# Patient Record
Sex: Male | Born: 1995 | Hispanic: Yes | Marital: Single | Smoking: Current every day smoker
Health system: Southern US, Community
[De-identification: ages and names within clinical notes are randomized; demographics above are authoritative.]

## PROBLEM LIST (undated history)

## (undated) DIAGNOSIS — Z720 Tobacco use: Secondary | ICD-10-CM

## (undated) DIAGNOSIS — F1410 Cocaine abuse, uncomplicated: Secondary | ICD-10-CM

## (undated) DIAGNOSIS — F1010 Alcohol abuse, uncomplicated: Secondary | ICD-10-CM

## (undated) DIAGNOSIS — F1290 Cannabis use, unspecified, uncomplicated: Secondary | ICD-10-CM

---
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
## 2016-12-17 ENCOUNTER — Encounter (HOSPITAL_COMMUNITY): Payer: Self-pay | Admitting: Cardiology

## 2016-12-17 ENCOUNTER — Emergency Department (HOSPITAL_COMMUNITY)
Admission: EM | Admit: 2016-12-17 | Discharge: 2016-12-17 | Disposition: A | Discharge status: Home / Self Care | Payer: Self-pay | Attending: Emergency Medicine | Admitting: Emergency Medicine

## 2016-12-17 DIAGNOSIS — R002 Palpitations: Secondary | ICD-10-CM | POA: Insufficient documentation

## 2016-12-17 DIAGNOSIS — F1099 Alcohol use, unspecified with unspecified alcohol-induced disorder: Secondary | ICD-10-CM | POA: Insufficient documentation

## 2016-12-17 DIAGNOSIS — F1210 Cannabis abuse, uncomplicated: Secondary | ICD-10-CM | POA: Insufficient documentation

## 2016-12-17 DIAGNOSIS — Z79899 Other long term (current) drug therapy: Secondary | ICD-10-CM | POA: Insufficient documentation

## 2016-12-17 DIAGNOSIS — F1410 Cocaine abuse, uncomplicated: Secondary | ICD-10-CM | POA: Insufficient documentation

## 2016-12-17 DIAGNOSIS — F17210 Nicotine dependence, cigarettes, uncomplicated: Secondary | ICD-10-CM | POA: Insufficient documentation

## 2016-12-17 LAB — I-STAT CHEM 8, ED
BUN: <3 mg/dL — ABNORMAL LOW (ref 6–20)
CREATININE: 0.9 mg/dL (ref 0.61–1.24)
Calcium, Ion: 1.11 mmol/L — ABNORMAL LOW (ref 1.15–1.40)
Chloride: 102 mmol/L (ref 101–111)
Glucose, Bld: 105 mg/dL — ABNORMAL HIGH (ref 65–99)
HEMATOCRIT: 43 % (ref 39.0–52.0)
HEMOGLOBIN: 14.6 g/dL (ref 13.0–17.0)
POTASSIUM: 3.6 mmol/L (ref 3.5–5.1)
SODIUM: 142 mmol/L (ref 135–145)
TCO2: 26 mmol/L (ref 0–100)

## 2016-12-17 NOTE — ED Triage Notes (Signed)
C/o heart racing.  Used cocaine 2 hours ago. Denies any pain.

## 2016-12-17 NOTE — Discharge Instructions (Signed)
Please stop using drugs or you will eventually end up on the streets as an addict.   If you need help there are multiple agencies in the area.  Thank you

## 2016-12-17 NOTE — ED Provider Notes (Signed)
[facility]-EMERGENCY DEPT Provider Note   CSN: [id number] Arrival date & time: 12/17/16  4098     History   Chief Complaint Chief Complaint  Patient presents with  . Tachycardia    HPI Scott Reilly is a 21 y.o. male.  Patient with history of alcohol use, marijuana use, cigarette smoking presents with palpitations and heart racing. Occurred approximately 2 hours ago after using cocaine. This is his first time using a party. Patient denies current symptoms.      History reviewed. No pertinent past medical history.  There are no active problems to display for this patient.   History reviewed. No pertinent surgical history.     Home Medications    Prior to Admission medications   Medication Sig Start Date End Date Taking? Authorizing Provider  potassium chloride SA (K-DUR,KLOR-CON) 20 MEQ tablet Take 2 tablets (40 mEq total) by mouth 2 (two) times daily. 05/26/14   Dione Booze, MD    Family History History reviewed. No pertinent family history.  Social History Social History  Substance Use Topics  . Smoking status: Current Some Day Smoker  . Smokeless tobacco: Never Used  . Alcohol use Yes     Comment: every other day      Allergies   Patient has no known allergies.   Review of Systems Review of Systems  Constitutional: Negative for fever.  Respiratory: Negative for shortness of breath.   Cardiovascular: Positive for palpitations. Negative for chest pain.  Gastrointestinal: Negative for abdominal pain and vomiting.  Musculoskeletal: Negative for back pain, neck pain and neck stiffness.  Skin: Negative for rash.  Neurological: Negative for light-headedness and headaches.     Physical Exam Updated Vital Signs BP 120/65   Pulse 99   Temp 98.9 F (37.2 C) (Oral)   Resp 16   Ht 5\' 7"  (1.702 m)   Wt 72.6 kg (160 lb)   SpO2 100%   BMI 25.06 kg/m   Physical Exam  Constitutional: He is oriented to person, place, and time. He appears well-developed and  well-nourished.  HENT:  Head: Normocephalic and atraumatic.  Eyes: Conjunctivae are normal. Right eye exhibits no discharge. Left eye exhibits no discharge.  Neck: Neck supple. No tracheal deviation present.  Cardiovascular: Regular rhythm.  Tachycardia present.   Pulmonary/Chest: Effort normal and breath sounds normal.  Abdominal: Soft.  Musculoskeletal: He exhibits no edema.  Neurological: He is alert and oriented to person, place, and time.  Skin: Skin is warm. No rash noted.  Psychiatric: He has a normal mood and affect.  Nursing note and vitals reviewed.    ED Treatments / Results  Labs (all labs ordered are listed, but only abnormal results are displayed) Labs Reviewed  I-STAT CHEM 8, ED - Abnormal; Notable for the following:       Result Value   BUN <3 (*)    Glucose, Bld 105 (*)    Calcium, Ion 1.11 (*)    All other components within normal limits    EKG  EKG Interpretation  Date/Time:  Saturday December 17 2016 07:26:51 EDT Ventricular Rate:  110 PR Interval:    QRS Duration: 89 QT Interval:  340 QTC Calculation: 460 R Axis:   50 Text Interpretation:  Sinus tachycardia Confirmed by Blane Ohara [phone]) on 12/17/2016 7:39:38 AM       Radiology No results found.  Procedures Procedures (including critical care time)  Medications Ordered in ED Medications - No data to display   Initial Impression / Assessment  and Plan / ED Course  I have reviewed the triage vital signs and the nursing notes.  Pertinent labs & imaging results that were available during my care of the patient were reviewed by me and considered in my medical decision making (see chart for details).    Patient presents after palpitations following drug abuse with cocaine. We shared in a long discussion with his risky behavior and where he is likely headed if this continues. Patient understands and is serious about decreasing drug use. EKG mild tachycardia as expected. Results and differential  diagnosis were discussed with the patient/parent/guardian. Xrays were independently reviewed by myself.  Close follow up outpatient was discussed, comfortable with the plan.   Medications - No data to display  Vitals:   12/17/16 0726 12/17/16 0730 12/17/16 0800 12/17/16 0830  BP: (!) 145/89 130/88 129/77 120/65  Pulse: (!) 111 (!) 101 93 99  Resp: 16 17 19 16   Temp: 98.9 F (37.2 C)     TempSrc: Oral     SpO2: 99% 100% 98% 100%  Weight:      Height:        Final diagnoses:  Cocaine abuse  Palpitations     Final Clinical Impressions(s) / ED Diagnoses   Final diagnoses:  Cocaine abuse  Palpitations    New Prescriptions New Prescriptions   No medications on file     Blane OharaZavitz, [place], MD 12/17/16 [phone]

## 2016-12-26 ENCOUNTER — Encounter (HOSPITAL_COMMUNITY): Payer: Self-pay

## 2016-12-26 DIAGNOSIS — F1010 Alcohol abuse, uncomplicated: Secondary | ICD-10-CM | POA: Insufficient documentation

## 2016-12-26 DIAGNOSIS — Z5321 Procedure and treatment not carried out due to patient leaving prior to being seen by health care provider: Secondary | ICD-10-CM | POA: Insufficient documentation

## 2016-12-26 NOTE — ED Triage Notes (Signed)
Reports of drinking 15 beers last night. States he slept all day and woke up having fatigue and tingling in bilateral arms.

## 2016-12-27 ENCOUNTER — Emergency Department (HOSPITAL_COMMUNITY)
Admission: EM | Admit: 2016-12-27 | Discharge: 2016-12-27 | Discharge status: Against Medical Advice | Payer: Self-pay | Attending: Emergency Medicine | Admitting: Emergency Medicine

## 2016-12-27 HISTORY — DX: Cocaine abuse, uncomplicated: F14.10

## 2016-12-27 NOTE — ED Notes (Signed)
No answer from the waiting area  

## 2016-12-27 NOTE — ED Notes (Signed)
No answer from waiting room.

## 2017-01-18 ENCOUNTER — Emergency Department (HOSPITAL_COMMUNITY)
Admission: EM | Admit: 2017-01-18 | Discharge: 2017-01-18 | Disposition: A | Discharge status: Home / Self Care | Payer: Self-pay | Attending: Emergency Medicine | Admitting: Emergency Medicine

## 2017-01-18 ENCOUNTER — Encounter (HOSPITAL_COMMUNITY): Payer: Self-pay | Admitting: Cardiology

## 2017-01-18 DIAGNOSIS — S61210A Laceration without foreign body of right index finger without damage to nail, initial encounter: Secondary | ICD-10-CM | POA: Insufficient documentation

## 2017-01-18 DIAGNOSIS — Y939 Activity, unspecified: Secondary | ICD-10-CM | POA: Insufficient documentation

## 2017-01-18 DIAGNOSIS — Y929 Unspecified place or not applicable: Secondary | ICD-10-CM | POA: Insufficient documentation

## 2017-01-18 DIAGNOSIS — Y999 Unspecified external cause status: Secondary | ICD-10-CM | POA: Insufficient documentation

## 2017-01-18 DIAGNOSIS — W25XXXA Contact with sharp glass, initial encounter: Secondary | ICD-10-CM | POA: Insufficient documentation

## 2017-01-18 DIAGNOSIS — Z5321 Procedure and treatment not carried out due to patient leaving prior to being seen by health care provider: Secondary | ICD-10-CM | POA: Insufficient documentation

## 2017-01-18 NOTE — ED Notes (Signed)
Patient seen walking towards acute ED and down hallway towards exit. Patient verbalizing since arrival he wanted to walk home. Patient encouraged to stay for treatment. Security aware of patient leaving the ED.
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
## 2017-04-02 ENCOUNTER — Emergency Department (HOSPITAL_COMMUNITY): Payer: Self-pay

## 2017-04-02 ENCOUNTER — Encounter (HOSPITAL_COMMUNITY): Payer: Self-pay | Admitting: Emergency Medicine

## 2017-04-02 ENCOUNTER — Emergency Department (HOSPITAL_COMMUNITY)
Admission: EM | Admit: 2017-04-02 | Discharge: 2017-04-02 | Disposition: A | Discharge status: Home / Self Care | Payer: Self-pay | Attending: Emergency Medicine | Admitting: Emergency Medicine

## 2017-04-02 DIAGNOSIS — Z79899 Other long term (current) drug therapy: Secondary | ICD-10-CM | POA: Insufficient documentation

## 2017-04-02 DIAGNOSIS — F17210 Nicotine dependence, cigarettes, uncomplicated: Secondary | ICD-10-CM | POA: Insufficient documentation

## 2017-04-02 DIAGNOSIS — R0789 Other chest pain: Secondary | ICD-10-CM | POA: Insufficient documentation

## 2017-04-02 LAB — CBC
HCT: 46.6 % (ref 39.0–52.0)
HEMOGLOBIN: 16.5 g/dL (ref 13.0–17.0)
MCH: 32.1 pg (ref 26.0–34.0)
MCHC: 35.4 g/dL (ref 30.0–36.0)
MCV: 90.7 fL (ref 78.0–100.0)
Platelets: 288 10*3/uL (ref 150–400)
RBC: 5.14 MIL/uL (ref 4.22–5.81)
RDW: 12.9 % (ref 11.5–15.5)
WBC: 7.6 10*3/uL (ref 4.0–10.5)

## 2017-04-02 LAB — BASIC METABOLIC PANEL
Anion gap: 10 (ref 5–15)
BUN: 6 mg/dL (ref 6–20)
CO2: 25 mmol/L (ref 22–32)
Calcium: 9.6 mg/dL (ref 8.9–10.3)
Chloride: 102 mmol/L (ref 101–111)
Creatinine, Ser: 0.85 mg/dL (ref 0.61–1.24)
GFR calc non Af Amer: >60 mL/min (ref >60)
Glucose, Bld: 95 mg/dL (ref 65–99)
POTASSIUM: 3.6 mmol/L (ref 3.5–5.1)
SODIUM: 137 mmol/L (ref 135–145)

## 2017-04-02 LAB — TROPONIN I: Troponin I: <0.03 ng/mL (ref <0.03)

## 2017-04-02 MED ORDER — PREDNISONE 20 MG PO TABS: 40.0000 mg | ORAL_TABLET | Freq: Every day | ORAL | 0 refills | Status: DC

## 2017-04-02 MED ORDER — PREDNISONE 50 MG PO TABS
60.0000 mg | ORAL_TABLET | ORAL | Status: AC
  Administered 2017-04-02: 60 mg via ORAL
  Filled 2017-04-02: qty 1

## 2017-04-02 NOTE — ED Triage Notes (Addendum)
Patient c/o left side chest pain that radiates into left arm with numbness and tingling in arm and face x1 hour. Patient reports shortness of breath, dizziness, and generalized weakness associated with chest pain. Denies any cardiac hx. Patient reports only using marijuana and cocaine yesterday.

## 2017-04-02 NOTE — ED Provider Notes (Signed)
[facility] EMERGENCY DEPARTMENT Provider Note   CSN: [id number] Arrival date & time: 04/02/17  1831     History   Chief Complaint Chief Complaint  Patient presents with  . Chest Pain    HPI Scott Reilly is a 21 y.o. male.  HPI Patient presents with concern of chest pain, left arm pain, left leg tingling, and facial tingling. Symptoms began 4 or 5 days ago. Since onset symptoms have been persistent. Patient was well prior to the onset of symptoms. He notes that since onset he has continued to drink alcohol smoke cigarettes, use cocaine, and these have not appreciably changed condition beyond a slight worsening of his chest pressure with smoking cigarettes. No dyspnea, no fever, no vomiting. No history of cardiac disease, no congenital issues. Past Medical History:  Diagnosis Date  . Cocaine abuse (HCC)     There are no active problems to display for this patient.   History reviewed. No pertinent surgical history.     Home Medications    Prior to Admission medications   Medication Sig Start Date End Date Taking? Authorizing Provider  potassium chloride SA (K-DUR,KLOR-CON) 20 MEQ tablet Take 2 tablets (40 mEq total) by mouth 2 (two) times daily. 05/26/14   Dione Booze, MD    Family History No family history on file.  Social History Social History  Substance Use Topics  . Smoking status: Current Some Day Smoker    Packs/day: 1.00    Types: Cigarettes  . Smokeless tobacco: Never Used  . Alcohol use Yes     Comment: every other day      Allergies   Patient has no known allergies.   Review of Systems Review of Systems  Constitutional:       Per HPI, otherwise negative  HENT:       Per HPI, otherwise negative  Respiratory:       Per HPI, otherwise negative  Cardiovascular:       Per HPI, otherwise negative  Gastrointestinal: Negative for vomiting.  Endocrine:       Negative aside from HPI  Genitourinary:       Neg aside from HPI     Musculoskeletal:       Per HPI, otherwise negative  Skin: Negative.   Neurological: Negative for syncope.     Physical Exam Updated Vital Signs BP 120/72   Pulse 73   Temp 97.9 F (36.6 C) (Oral)   Resp 20   Ht 5\' 9"  (1.753 m)   Wt 77.1 kg (170 lb)   SpO2 97%   BMI 25.10 kg/m   Physical Exam  Constitutional: He is oriented to person, place, and time. He appears well-developed. No distress.  HENT:  Head: Normocephalic and atraumatic.  Eyes: Conjunctivae and EOM are normal.  Cardiovascular: Normal rate and regular rhythm.   Pulmonary/Chest: Effort normal. No stridor. No respiratory distress.  Abdominal: He exhibits no distension.  Musculoskeletal: He exhibits no edema.  Neurological: He is alert and oriented to person, place, and time.  Skin: Skin is warm and dry.  Psychiatric: He has a normal mood and affect.  Nursing note and vitals reviewed.    ED Treatments / Results  Labs (all labs ordered are listed, but only abnormal results are displayed) Labs Reviewed  BASIC METABOLIC PANEL  CBC  TROPONIN I    EKG  EKG Interpretation  Date/Time:  Sunday April 02 2017 18:40:58 EDT Ventricular Rate:  96 PR Interval:  132 QRS Duration: 90 QT  Interval:  350 QTC Calculation: 442 R Axis:   -2 Text Interpretation:  Normal sinus rhythm with sinus arrhythmia Minimal voltage criteria for LVH, may be normal variant Borderline ECG No significant change since last tracing Confirmed by Marily MemosMesner, Jason [phone]([id number]) on 04/02/2017 6:49:20 PM       Radiology I reviewed the x-ray, agree with the interpretation Procedures Procedures (including critical care time)    Initial Impression / Assessment and Plan / ED Course  I have reviewed the triage vital signs and the nursing notes.  Pertinent labs & imaging results that were available during my care of the patient were reviewed by me and considered in my medical decision making (see chart for details).  On repeat exam the patient  is in no distress. Reassuring labs, vitals. Some suspicion for smoking inducing the patient's chest pressure, and he was encouraged to stop smoking, as well as stop using illicit substances. With reassuring findings, no evidence for ongoing coronary ischemia, or other acute new pathology, the patient was discharged in stable condition with outpatient follow-up.  Final Clinical Impressions(s) / ED Diagnoses  Atypical chest pain   Gerhard MunchLockwood, Denica Web, MD 04/02/17 2100

## 2017-04-02 NOTE — Discharge Instructions (Signed)
As discussed, your evaluation today has been largely reassuring.  But, it is important that you monitor your condition carefully, and do not hesitate to return to the ED if you develop new, or concerning changes in your condition. ? ?Otherwise, please follow-up with your physician for appropriate ongoing care. ? ?

## 2017-04-18 ENCOUNTER — Encounter (HOSPITAL_COMMUNITY): Payer: Self-pay | Admitting: *Deleted

## 2017-04-18 ENCOUNTER — Emergency Department (HOSPITAL_COMMUNITY)
Admission: EM | Admit: 2017-04-18 | Discharge: 2017-04-18 | Discharge status: Against Medical Advice | Payer: Self-pay | Attending: Emergency Medicine | Admitting: Emergency Medicine

## 2017-04-18 ENCOUNTER — Emergency Department
Admission: EM | Admit: 2017-04-18 | Discharge: 2017-04-18 | Disposition: A | Discharge status: Home / Self Care | Payer: Self-pay | Attending: Student in an Organized Health Care Education/Training Program | Admitting: Student in an Organized Health Care Education/Training Program

## 2017-04-18 ENCOUNTER — Other Ambulatory Visit: Payer: Self-pay

## 2017-04-18 ENCOUNTER — Encounter: Payer: Self-pay | Admitting: Emergency Medicine

## 2017-04-18 DIAGNOSIS — R200 Anesthesia of skin: Secondary | ICD-10-CM | POA: Insufficient documentation

## 2017-04-18 DIAGNOSIS — R0789 Other chest pain: Secondary | ICD-10-CM | POA: Insufficient documentation

## 2017-04-18 DIAGNOSIS — R1013 Epigastric pain: Secondary | ICD-10-CM | POA: Insufficient documentation

## 2017-04-18 DIAGNOSIS — R202 Paresthesia of skin: Secondary | ICD-10-CM

## 2017-04-18 DIAGNOSIS — Z5320 Procedure and treatment not carried out because of patient's decision for unspecified reasons: Secondary | ICD-10-CM | POA: Insufficient documentation

## 2017-04-18 DIAGNOSIS — R079 Chest pain, unspecified: Secondary | ICD-10-CM

## 2017-04-18 DIAGNOSIS — F17210 Nicotine dependence, cigarettes, uncomplicated: Secondary | ICD-10-CM | POA: Insufficient documentation

## 2017-04-18 DIAGNOSIS — Z79899 Other long term (current) drug therapy: Secondary | ICD-10-CM | POA: Insufficient documentation

## 2017-04-18 LAB — CBC
HCT: 46.4 % (ref 40.0–52.0)
Hemoglobin: 16.1 g/dL (ref 13.0–18.0)
MCH: 31.7 pg (ref 26.0–34.0)
MCHC: 34.6 g/dL (ref 32.0–36.0)
MCV: 91.5 fL (ref 80.0–100.0)
PLATELETS: 274 10*3/uL (ref 150–440)
RBC: 5.07 MIL/uL (ref 4.40–5.90)
RDW: 13.4 % (ref 11.5–14.5)
WBC: 7.2 10*3/uL (ref 3.8–10.6)

## 2017-04-18 LAB — COMPREHENSIVE METABOLIC PANEL
ALK PHOS: 84 U/L (ref 38–126)
ALT: 24 U/L (ref 17–63)
ANION GAP: 8 (ref 5–15)
AST: 25 U/L (ref 15–41)
Albumin: 4.6 g/dL (ref 3.5–5.0)
BUN: 9 mg/dL (ref 6–20)
CALCIUM: 9.6 mg/dL (ref 8.9–10.3)
CO2: 25 mmol/L (ref 22–32)
CREATININE: 0.91 mg/dL (ref 0.61–1.24)
Chloride: 104 mmol/L (ref 101–111)
Glucose, Bld: 101 mg/dL — ABNORMAL HIGH (ref 65–99)
Potassium: 4.3 mmol/L (ref 3.5–5.1)
Sodium: 137 mmol/L (ref 135–145)
Total Bilirubin: 0.8 mg/dL (ref 0.3–1.2)
Total Protein: 7.8 g/dL (ref 6.5–8.1)

## 2017-04-18 LAB — URINALYSIS, COMPLETE (UACMP) WITH MICROSCOPIC
BILIRUBIN URINE: NEGATIVE
Bacteria, UA: NONE SEEN
GLUCOSE, UA: NEGATIVE mg/dL
HGB URINE DIPSTICK: NEGATIVE
KETONES UR: NEGATIVE mg/dL
Leukocytes, UA: NEGATIVE
NITRITE: NEGATIVE
PH: 6 (ref 5.0–8.0)
Protein, ur: NEGATIVE mg/dL
Specific Gravity, Urine: 1.023 (ref 1.005–1.030)

## 2017-04-18 LAB — LIPASE, BLOOD: LIPASE: 25 U/L (ref 11–51)

## 2017-04-18 MED ORDER — GI COCKTAIL ~~LOC~~
30.0000 mL | Freq: Once | ORAL | Status: AC
  Administered 2017-04-18: 30 mL via ORAL
  Filled 2017-04-18: qty 30

## 2017-04-18 MED ORDER — RANITIDINE HCL 150 MG PO TABS
150.0000 mg | ORAL_TABLET | Freq: Two times a day (BID) | ORAL | 1 refills | Status: DC
Start: 2017-04-18 — End: 2017-04-28

## 2017-04-18 NOTE — Discharge Instructions (Signed)

## 2017-04-18 NOTE — ED Notes (Addendum)
Pt to ed with c/o right flank and right lower quad pain, sharpe in nature.  States episode lasted about 30 minutes.  Pt denies pain at this time, states all symptoms have resolved feels better now, skin warm and dry.  Pt also admits to heavy drinking and use of cocaine, also heavy smoker. States he is worried that "there might be something wrong with my organs"

## 2017-04-18 NOTE — ED Notes (Signed)
Pt expressing thoughts of wanting to leave. Pt made aware he would be considered to be leaving AMA.

## 2017-04-18 NOTE — ED Notes (Signed)
Pt refusing to stay in hospital. Advised that he would be leaving against medical advice. Pt was stable upon leaving, he was ambulatory and verbalized he was no longer having any pain.

## 2017-04-18 NOTE — ED Provider Notes (Signed)
[facility]Y [facility]MENT Provider Note   CSN: [id number] Arrival date & time: 04/18/17  1230     History   Chief Complaint Chief Complaint  Patient presents with  . Chest Pain    HPI Scott Reilly is a 21 y.o. male.  HPI  The patient is a 21 year old male with a history of cocaine abuse, he denies any other chronic medical conditions and takes no daily medications.  He does drink heavy amounts of alcohol intermittently and states that he was drinking last night, had over 8 beers, he fell asleep and when he woke up this morning with "a hangover" he also had some numbness in the left side of his chest, the left arm and felt like his left arm was weak.  He does not recall how he slept but states that he felt awkward.  He denies any specific chest discomfort though he does have some left upper quadrant discomfort.  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  He never has exertional symptoms.  He denies any recent cocaine use.  He was seen for similar symptoms 2 weeks ago after consuming 16 beers one night  Past Medical History:  Diagnosis Date  . Cocaine abuse (HCC)     There are no active problems to display for this patient.   History reviewed. No pertinent surgical history.     Home Medications    Prior to Admission medications   Medication Sig Start Date End Date Taking? Authorizing Provider  potassium chloride SA (K-DUR,KLOR-CON) 20 MEQ tablet Take 2 tablets (40 mEq total) by mouth 2 (two) times daily. 05/26/14   Dione BoozeGlick, David, MD  predniSONE (DELTASONE) 20 MG tablet Take 2 tablets (40 mg total) by mouth daily with breakfast. For the next four days 04/02/17   Gerhard MunchLockwood, Robert, MD    Family History No family history on file.  Social History Social History   Tobacco Use  . Smoking status: Current Some Day Smoker    Packs/day: 1.00    Types: Cigarettes  . Smokeless tobacco:  Never Used  Substance Use Topics  . Alcohol use: Yes    Comment: every other day   . Drug use: Yes    Types: Cocaine, Marijuana     Allergies   Patient has no known allergies.   Review of Systems Review of Systems  All other systems reviewed and are negative.    Physical Exam Updated Vital Signs BP 117/60   Pulse 82   Temp 98.6 F (37 C)   Resp 16   Ht 5\' 8"  (1.727 m)   Wt 77.1 kg (170 lb)   SpO2 99%   BMI 25.85 kg/m   Physical Exam  Constitutional: He appears well-developed and well-nourished. No distress.  HENT:  Head: Normocephalic and atraumatic.  Mouth/Throat: Oropharynx is clear and moist. No oropharyngeal exudate.  Eyes: Conjunctivae and EOM are normal. Pupils are equal, round, and reactive to light. Right eye exhibits no discharge. Left eye exhibits no discharge. No scleral icterus.  Neck: Normal range of motion. Neck supple. No JVD present. No thyromegaly present.  Cardiovascular: Normal rate, regular rhythm, normal heart sounds and intact distal pulses. Exam reveals no gallop and no friction rub.  No murmur heard. Pulmonary/Chest: Effort normal and breath sounds normal. No respiratory distress. He has no wheezes. He has no rales.  Abdominal: Soft. Bowel sounds are normal. He exhibits no distension and  no mass. There is no tenderness.  No abdominal tenderness to palpation  Musculoskeletal: Normal range of motion. He exhibits no edema or tenderness.  Lymphadenopathy:    He has no cervical adenopathy.  Neurological: He is alert. Coordination normal.  The patient is able to lift both of his arms up into the air with good strength at the shoulders elbows and wrists.  He has no signs of radial nerve palsy.  Normal grips bilaterally, cranial nerves III through XII are normal, normal straight leg raise bilaterally, normal sensation to light touch in all 4 extremities.  Speech is clear and goal-directed  Skin: Skin is warm and dry. No rash noted. No erythema.    Psychiatric: He has a normal mood and affect. His behavior is normal.  Nursing note and vitals reviewed.    ED Treatments / Results  Labs (all labs ordered are listed, but only abnormal results are displayed) Labs Reviewed  LIPASE, BLOOD  COMPREHENSIVE METABOLIC PANEL  CBC    EKG  EKG Interpretation  Date/Time:  Tuesday April 18 2017 13:02:33 EST Ventricular Rate:  91 PR Interval:    QRS Duration: 78 QT Interval:  346 QTC Calculation: 426 R Axis:   63 Text Interpretation:  Sinus rhythm Normal ECG since last tracing no significant change Confirmed by Eber HongMiller, Ceanna Wareing ([id number]) on 04/18/2017 1:50:19 PM       Radiology No results found.  Procedures Procedures (including critical care time)  Medications Ordered in ED Medications  gi cocktail (Maalox,Lidocaine,Donnatal) (30 mLs Oral Given 04/18/17 1307)     Initial Impression / Assessment and Plan / ED Course  I have reviewed the triage vital signs and the nursing notes.  Pertinent labs & imaging results that were available during my care of the patient were reviewed by me and considered in my medical decision making (see chart for details).    A likely answer would be alcoholic gastritis though I would also consider pancreatitis a possibility.  GI cocktail, labs and EKG.  No indication for imaging at this time.  The left upper extremity symptoms do not correlate with a radial nerve palsy and in fact at this time he has no objective findings of neurologic dysfunction.  The patient has left AGAINST MEDICAL ADVICE, I was not informed that the patient was leaving, the nurse had a discussion with the patient that he should stay, he refused, he refused blood draw, he left AGAINST MEDICAL ADVICE after signing paperwork.  Final Clinical Impressions(s) / ED Diagnoses   Final diagnoses:  Left sided chest pain  Numbness and tingling in left arm    ED Discharge Orders    None       Eber HongMiller, Ramiyah Mcclenahan, MD 04/18/17 1350

## 2017-04-18 NOTE — ED Provider Notes (Signed)
[facility] Emergency Department Provider Note    First MD Initiated Contact with Patient 04/18/17 1740     (approximate)  I have reviewed the triage vital signs and the nursing notes.   HISTORY  Chief Complaint Abdominal Pain    HPI Scott Reilly is a 21 y.o. male presents with chief complaint of epigastric pain and right flank pain.  Patient seen for similar symptoms at any point hospitalist morning.  Patient admits to drinking 8 beers last night smoking 1 pack of cigarettes and doing several "bumps" of cocaine.  Says the pain is brief in nature and mild to moderate.  He denies any fevers. Past Medical History:  Diagnosis Date  . Cocaine abuse (HCC)    No family history on file. History reviewed. No pertinent surgical history. There are no active problems to display for this patient.     Prior to Admission medications   Medication Sig Start Date End Date Taking? Authorizing Provider  potassium chloride SA (K-DUR,KLOR-CON) 20 MEQ tablet Take 2 tablets (40 mEq total) by mouth 2 (two) times daily. 05/26/14   Dione BoozeGlick, David, MD  predniSONE (DELTASONE) 20 MG tablet Take 2 tablets (40 mg total) by mouth daily with breakfast. For the next four days 04/02/17   Gerhard MunchLockwood, Robert, MD    Allergies Patient has no known allergies.    Social History Social History   Tobacco Use  . Smoking status: Current Some Day Smoker    Packs/day: 1.00    Types: Cigarettes  . Smokeless tobacco: Never Used  Substance Use Topics  . Alcohol use: Yes    Comment: "on weekends"  . Drug use: Yes    Types: Cocaine, Marijuana    Review of Systems Patient denies headaches, rhinorrhea, blurry vision, numbness, shortness of breath, chest pain, edema, cough, abdominal pain, nausea, vomiting, diarrhea, dysuria, fevers, rashes or hallucinations unless otherwise stated above in HPI. ____________________________________________   PHYSICAL EXAM:  VITAL SIGNS: Vitals:   04/18/17 1532  BP: 120/80  Pulse: 72  Resp: 16  Temp: 98.2 F (36.8 C)  SpO2: 100%    Constitutional: Alert and oriented. Well appearing and in no acute distress. Eyes: Conjunctivae are normal.  Head: Atraumatic. Nose: No congestion/rhinnorhea. Mouth/Throat: Mucous membranes are moist.   Neck: No stridor. Painless ROM.  Cardiovascular: Normal rate, regular rhythm. Grossly normal heart sounds.  Good peripheral circulation. Respiratory: Normal respiratory effort.  No retractions. Lungs CTAB. Gastrointestinal: Soft and nontender. No distention. No abdominal bruits. No CVA tenderness. Genitourinary:  Musculoskeletal: No lower extremity tenderness nor edema.  No joint effusions. Neurologic:  Normal speech and language. No gross focal neurologic deficits are appreciated. No facial droop Skin:  Skin is warm, dry and intact. No rash noted. Psychiatric: Mood and affect are normal. Speech and behavior are normal.  ____________________________________________   LABS (all labs ordered are listed, but only abnormal results are displayed)  Results for orders placed or performed during the hospital encounter of 04/18/17 (from the past 24 hour(s))  Lipase, blood     Status: None   Collection Time: 04/18/17  3:34 PM  Result Value Ref Range   Lipase 25 11 - 51 U/L  Comprehensive metabolic panel     Status: Abnormal   Collection Time: 04/18/17  3:34 PM  Result Value Ref Range   Sodium 137 135 - 145 mmol/L   Potassium 4.3 3.5 - 5.1 mmol/L   Chloride 104 101 - 111 mmol/L   CO2 25 22 - 32  mmol/L   Glucose, Bld 101 (H) 65 - 99 mg/dL   BUN 9 6 - 20 mg/dL   Creatinine, Ser 8.240.91 0.61 - 1.24 mg/dL   Calcium 9.6 8.9 - 23.510.3 mg/dL   Total Protein 7.8 6.5 - 8.1 g/dL   Albumin 4.6 3.5 - 5.0 g/dL   AST 25 15 - 41 U/L   ALT 24 17 - 63 U/L   Alkaline Phosphatase 84 38 - 126 U/L   Total Bilirubin 0.8 0.3 - 1.2 mg/dL   GFR calc non Af Amer >60 >60 mL/min   GFR calc Af Amer >60 >60 mL/min   Anion gap  8 5 - 15  CBC     Status: None   Collection Time: 04/18/17  3:34 PM  Result Value Ref Range   WBC 7.2 3.8 - 10.6 K/uL   RBC 5.07 4.40 - 5.90 MIL/uL   Hemoglobin 16.1 13.0 - 18.0 g/dL   HCT 36.146.4 44.340.0 - 15.452.0 %   MCV 91.5 80.0 - 100.0 fL   MCH 31.7 26.0 - 34.0 pg   MCHC 34.6 32.0 - 36.0 g/dL   RDW 00.813.4 67.611.5 - 19.514.5 %   Platelets 274 150 - 440 K/uL  Urinalysis, Complete w Microscopic     Status: Abnormal   Collection Time: 04/18/17  3:34 PM  Result Value Ref Range   Color, Urine YELLOW (A) YELLOW   APPearance CLEAR (A) CLEAR   Specific Gravity, Urine 1.023 1.005 - 1.030   pH 6.0 5.0 - 8.0   Glucose, UA NEGATIVE NEGATIVE mg/dL   Hgb urine dipstick NEGATIVE NEGATIVE   Bilirubin Urine NEGATIVE NEGATIVE   Ketones, ur NEGATIVE NEGATIVE mg/dL   Protein, ur NEGATIVE NEGATIVE mg/dL   Nitrite NEGATIVE NEGATIVE   Leukocytes, UA NEGATIVE NEGATIVE   RBC / HPF 0-5 0 - 5 RBC/hpf   WBC, UA 0-5 0 - 5 WBC/hpf   Bacteria, UA NONE SEEN NONE SEEN   Squamous Epithelial / LPF 0-5 (A) NONE SEEN   Mucus PRESENT    ____________________________________________  EKG__________________________________  RADIOLOGY   ____________________________________________   PROCEDURES  Procedure(s) performed:  Procedures    Critical Care performed: no ____________________________________________   INITIAL IMPRESSION / ASSESSMENT AND PLAN / ED COURSE  Pertinent labs & imaging results that were available during my care of the patient were reviewed by me and considered in my medical decision making (see chart for details).  DDX: Appendicitis, stone, colitis, gastritis, pancreatitis  Scott Reilly is a 21 y.o. who presents to the ED with symptoms as described above.  He is afebrile Heema dynamically stable.  Blood work is reassuring.  Abdominal exam is soft and benign.  No evidence of urinary tract infection.  This not clinically consistent with acute appendicitis.  No evidence of pancreatitis.  Most  consistent with alcoholic gastritis.  Discussed cessation from smoking as well as offered resources for help with alcohol detox and cessation from cocaine.  Have discussed with the patient and available family all diagnostics and treatments performed thus far and all questions were answered to the best of my ability. The patient demonstrates understanding and agreement with plan.       ____________________________________________   FINAL CLINICAL IMPRESSION(S) / ED DIAGNOSES  Final diagnoses:  Epigastric discomfort      NEW MEDICATIONS STARTED DURING THIS VISIT:  This SmartLink is deprecated. Use AVSMEDLIST instead to display the medication list for a patient.   Note:  This document was prepared using Conservation officer, historic buildingsDragon voice recognition software  and may include unintentional dictation errors.    Willy Eddy, MD 04/18/17 [facility]

## 2017-04-18 NOTE — ED Notes (Signed)
Pt refused labs.  
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
## 2017-04-18 NOTE — ED Triage Notes (Signed)
C/o numbness in left side of chest onset this am , states he drank 7 beers last night, recently seen for the same

## 2017-04-28 ENCOUNTER — Emergency Department (HOSPITAL_COMMUNITY): Payer: Self-pay

## 2017-04-28 ENCOUNTER — Encounter (HOSPITAL_COMMUNITY): Payer: Self-pay | Admitting: Emergency Medicine

## 2017-04-28 ENCOUNTER — Emergency Department (HOSPITAL_COMMUNITY)
Admission: EM | Admit: 2017-04-28 | Discharge: 2017-04-28 | Disposition: A | Discharge status: Home / Self Care | Payer: Self-pay | Attending: Emergency Medicine | Admitting: Emergency Medicine

## 2017-04-28 DIAGNOSIS — F1410 Cocaine abuse, uncomplicated: Secondary | ICD-10-CM | POA: Insufficient documentation

## 2017-04-28 DIAGNOSIS — F1010 Alcohol abuse, uncomplicated: Secondary | ICD-10-CM | POA: Insufficient documentation

## 2017-04-28 DIAGNOSIS — R202 Paresthesia of skin: Secondary | ICD-10-CM | POA: Insufficient documentation

## 2017-04-28 DIAGNOSIS — F17210 Nicotine dependence, cigarettes, uncomplicated: Secondary | ICD-10-CM | POA: Insufficient documentation

## 2017-04-28 DIAGNOSIS — F1210 Cannabis abuse, uncomplicated: Secondary | ICD-10-CM | POA: Insufficient documentation

## 2017-04-28 HISTORY — DX: Alcohol abuse, uncomplicated: F10.10

## 2017-04-28 HISTORY — DX: Tobacco use: Z72.0

## 2017-04-28 HISTORY — DX: Cannabis use, unspecified, uncomplicated: F12.90

## 2017-04-28 LAB — TROPONIN I

## 2017-04-28 LAB — URINALYSIS, ROUTINE W REFLEX MICROSCOPIC
BILIRUBIN URINE: NEGATIVE
Glucose, UA: NEGATIVE mg/dL
Hgb urine dipstick: NEGATIVE
KETONES UR: NEGATIVE mg/dL
Leukocytes, UA: NEGATIVE
NITRITE: NEGATIVE
PH: 6 (ref 5.0–8.0)
Protein, ur: NEGATIVE mg/dL
Specific Gravity, Urine: 1.011 (ref 1.005–1.030)

## 2017-04-28 LAB — COMPREHENSIVE METABOLIC PANEL
ALBUMIN: 4.5 g/dL (ref 3.5–5.0)
ALT: 37 U/L (ref 17–63)
ANION GAP: 7 (ref 5–15)
AST: 27 U/L (ref 15–41)
Alkaline Phosphatase: 82 U/L (ref 38–126)
BILIRUBIN TOTAL: 0.8 mg/dL (ref 0.3–1.2)
BUN: 10 mg/dL (ref 6–20)
CALCIUM: 9.3 mg/dL (ref 8.9–10.3)
CO2: 28 mmol/L (ref 22–32)
CREATININE: 0.72 mg/dL (ref 0.61–1.24)
Chloride: 100 mmol/L — ABNORMAL LOW (ref 101–111)
GFR calc Af Amer: >60 mL/min (ref >60)
GFR calc non Af Amer: >60 mL/min (ref >60)
GLUCOSE: 95 mg/dL (ref 65–99)
Potassium: 4 mmol/L (ref 3.5–5.1)
SODIUM: 135 mmol/L (ref 135–145)
TOTAL PROTEIN: 7.3 g/dL (ref 6.5–8.1)

## 2017-04-28 LAB — CBC WITH DIFFERENTIAL/PLATELET
BASOS PCT: 0 %
Basophils Absolute: 0 10*3/uL (ref 0.0–0.1)
EOS ABS: 0.1 10*3/uL (ref 0.0–0.7)
Eosinophils Relative: 2 %
HCT: 46.3 % (ref 39.0–52.0)
HEMOGLOBIN: 16.1 g/dL (ref 13.0–17.0)
LYMPHS ABS: 1.2 10*3/uL (ref 0.7–4.0)
Lymphocytes Relative: 37 %
MCH: 31.4 pg (ref 26.0–34.0)
MCHC: 34.8 g/dL (ref 30.0–36.0)
MCV: 90.4 fL (ref 78.0–100.0)
MONO ABS: 0.4 10*3/uL (ref 0.1–1.0)
MONOS PCT: 12 %
Neutro Abs: 1.6 10*3/uL — ABNORMAL LOW (ref 1.7–7.7)
Neutrophils Relative %: 49 %
Platelets: 216 10*3/uL (ref 150–400)
RBC: 5.12 MIL/uL (ref 4.22–5.81)
RDW: 12.8 % (ref 11.5–15.5)
WBC: 3.3 10*3/uL — ABNORMAL LOW (ref 4.0–10.5)

## 2017-04-28 LAB — RAPID URINE DRUG SCREEN, HOSP PERFORMED
AMPHETAMINES: NOT DETECTED
BARBITURATES: NOT DETECTED
Benzodiazepines: NOT DETECTED
Cocaine: NOT DETECTED
Opiates: NOT DETECTED
TETRAHYDROCANNABINOL: NOT DETECTED

## 2017-04-28 LAB — ETHANOL: Alcohol, Ethyl (B): <10 mg/dL (ref <10)

## 2017-04-28 NOTE — ED Triage Notes (Signed)
Pt reports intermittent numbness in different places for the past 1 1/2 weeks.  Pt says has history of drinking heavily and using marijuana and cocaine.  Says stopped drinking and using drugs 2 weeks ago.

## 2017-04-28 NOTE — ED Provider Notes (Signed)
[facility] Provider [facility]   CSN: [id number] Arrival date & time: 04/28/17  0805     History   Chief Complaint Chief Complaint  Patient presents with  . Numbness    HPI Scott Reilly is a 21 y.o. male.  HPI  Pt was seen at 0820. Per pt, c/o gradual onset and persistence of multiple intermittent episodes of paresthesias "that move all over" for the past 4 months. Pt states he has episodes of "tingling" and "numbness" feelings that come on in various spots of his body at random times. Denies one consistent area. States "someone told me it was the drugs or anxiety" but he "doesn't believe them."  Denies CP/palpitations, no SOB/cough, no abd pain, no N/V/D, no rash, no fevers, no focal motor weakness, no ataxia, no facial droop, no slurred speech. The symptoms have been associated with no other complaints. The patient has a significant history of similar symptoms previously, recently being evaluated for this complaint and multiple prior evals for same since 12/2016.     Past Medical History:  Diagnosis Date  . Alcohol abuse   . Cocaine abuse (HCC)   . Marijuana use   . Tobacco use     There are no active problems to display for this patient.   History reviewed. No pertinent surgical history.     Home Medications    Prior to Admission medications   Not on File    Family History No family history on file.  Social History Social History   Tobacco Use  . Smoking status: Current Every Day Smoker    Packs/day: 1.00    Types: Cigarettes  . Smokeless tobacco: Never Used  Substance Use Topics  . Alcohol use: Yes    Comment: "on weekends"  . Drug use: Yes    Types: Cocaine, Marijuana     Allergies   Patient has no known allergies.   Review of Systems Review of Systems ROS: Statement: All systems negative except as marked or noted in the HPI; Constitutional: Negative for fever and chills. ; ; Eyes: Negative for eye pain, redness and  discharge. ; ; ENMT: Negative for ear pain, hoarseness, nasal congestion, sinus pressure and sore throat. ; ; Cardiovascular: Negative for chest pain, palpitations, diaphoresis, dyspnea and peripheral edema. ; ; Respiratory: Negative for cough, wheezing and stridor. ; ; Gastrointestinal: Negative for nausea, vomiting, diarrhea, abdominal pain, blood in stool, hematemesis, jaundice and rectal bleeding. . ; ; Genitourinary: Negative for dysuria, flank pain and hematuria. ; ; Musculoskeletal: Negative for back pain and neck pain. Negative for swelling and trauma.; ; Skin: Negative for pruritus, rash, abrasions, blisters, bruising and skin lesion.; ; Neuro: +paresthesias. Negative for headache, lightheadedness and neck stiffness. Negative for weakness, altered level of consciousness, altered mental status, extremity weakness, involuntary movement, seizure and syncope.       Physical Exam Updated Vital Signs BP 116/69   Pulse 74   Temp 97.6 F (36.4 C) (Oral)   Resp 17   Ht 5\' 8"  (1.727 m)   Wt 77.1 kg (170 lb)   SpO2 100%   BMI 25.85 kg/m   Physical Exam 0825: Physical examination:  Nursing notes reviewed; Vital signs and O2 SAT reviewed;  Constitutional: Well developed, Well nourished, Well hydrated, In no acute distress; Head:  Normocephalic, atraumatic; Eyes: EOMI, PERRL, No scleral icterus; ENMT: TM's clear bilat. +edemetous nasal turbinates bilat with clear rhinorrhea. Mouth and pharynx normal, Mucous membranes moist; Neck: Supple, Full range of motion,  No lymphadenopathy; Cardiovascular: Regular rate and rhythm, No gallop; Respiratory: Breath sounds clear & equal bilaterally, No wheezes.  Speaking full sentences with ease, Normal respiratory effort/excursion; Chest: Nontender, Movement normal; Abdomen: Soft, Nontender, Nondistended, Normal bowel sounds; Genitourinary: No CVA tenderness; Extremities: Pulses normal, No tenderness, No edema, No calf edema or asymmetry.; Neuro: AA&Ox3, Major CN  grossly intact. No facial droop. Speech clear. No gross focal motor or sensory deficits in extremities. Climbs on and off stretcher easily by himself. Gait steady.; Skin: Color normal, Warm, Dry.; Psych:  Anxious.    ED Treatments / Results  Labs (all labs ordered are listed, but only abnormal results are displayed)   EKG  EKG Interpretation  Date/Time:  Friday April 28 2017 08:52:23 EST Ventricular Rate:  64 PR Interval:    QRS Duration: 86 QT Interval:  382 QTC Calculation: 395 R Axis:   74 Text Interpretation:  Sinus rhythm ST elev, probable normal early repol pattern When compared with ECG of 04/18/2017 No significant change was found Confirmed by Samuel JesterMcManus, Kayal Mula [phone]) on 04/28/2017 8:57:22 AM       Radiology   Procedures Procedures (including critical care time)  Medications Ordered in ED Medications - No data to display   Initial Impression / Assessment and Plan / ED Course  I have reviewed the triage vital signs and the nursing notes.  Pertinent labs & imaging results that were available during my care of the patient were reviewed by me and considered in my medical decision making (see chart for details).  MDM Reviewed: previous chart, nursing note and vitals Reviewed previous: ECG and labs Interpretation: labs, ECG, x-ray and CT scan   Results for orders placed or performed during the hospital encounter of 04/28/17  Urine rapid drug screen (hosp performed)  Result Value Ref Range   Opiates NONE DETECTED NONE DETECTED   Cocaine NONE DETECTED NONE DETECTED   Benzodiazepines NONE DETECTED NONE DETECTED   Amphetamines NONE DETECTED NONE DETECTED   Tetrahydrocannabinol NONE DETECTED NONE DETECTED   Barbiturates NONE DETECTED NONE DETECTED  Urinalysis, Routine w reflex microscopic  Result Value Ref Range   Color, Urine YELLOW YELLOW   APPearance CLEAR CLEAR   Specific Gravity, Urine 1.011 1.005 - 1.030   pH 6.0 5.0 - 8.0   Glucose, UA NEGATIVE  NEGATIVE mg/dL   Hgb urine dipstick NEGATIVE NEGATIVE   Bilirubin Urine NEGATIVE NEGATIVE   Ketones, ur NEGATIVE NEGATIVE mg/dL   Protein, ur NEGATIVE NEGATIVE mg/dL   Nitrite NEGATIVE NEGATIVE   Leukocytes, UA NEGATIVE NEGATIVE  Comprehensive metabolic panel  Result Value Ref Range   Sodium 135 135 - 145 mmol/L   Potassium 4.0 3.5 - 5.1 mmol/L   Chloride 100 (L) 101 - 111 mmol/L   CO2 28 22 - 32 mmol/L   Glucose, Bld 95 65 - 99 mg/dL   BUN 10 6 - 20 mg/dL   Creatinine, Ser 6.040.72 0.61 - 1.24 mg/dL   Calcium 9.3 8.9 - 54.010.3 mg/dL   Total Protein 7.3 6.5 - 8.1 g/dL   Albumin 4.5 3.5 - 5.0 g/dL   AST 27 15 - 41 U/L   ALT 37 17 - 63 U/L   Alkaline Phosphatase 82 38 - 126 U/L   Total Bilirubin 0.8 0.3 - 1.2 mg/dL   GFR calc non Af Amer >60 >60 mL/min   GFR calc Af Amer >60 >60 mL/min   Anion gap 7 5 - 15  Ethanol  Result Value Ref Range   Alcohol,  Ethyl (B) <10 <10 mg/dL  CBC with Differential  Result Value Ref Range   WBC 3.3 (L) 4.0 - 10.5 K/uL   RBC 5.12 4.22 - 5.81 MIL/uL   Hemoglobin 16.1 13.0 - 17.0 g/dL   HCT 16.1 09.6 - 04.5 %   MCV 90.4 78.0 - 100.0 fL   MCH 31.4 26.0 - 34.0 pg   MCHC 34.8 30.0 - 36.0 g/dL   RDW 40.9 81.1 - 91.4 %   Platelets 216 150 - 400 K/uL   Neutrophils Relative % 49 %   Neutro Abs 1.6 (L) 1.7 - 7.7 K/uL   Lymphocytes Relative 37 %   Lymphs Abs 1.2 0.7 - 4.0 K/uL   Monocytes Relative 12 %   Monocytes Absolute 0.4 0.1 - 1.0 K/uL   Eosinophils Relative 2 %   Eosinophils Absolute 0.1 0.0 - 0.7 K/uL   Basophils Relative 0 %   Basophils Absolute 0.0 0.0 - 0.1 K/uL  Troponin I  Result Value Ref Range   Troponin I <0.03 <0.03 ng/mL   Dg Chest 2 View Result Date: 04/28/2017 CLINICAL DATA:  Right-sided facial weakness.  Tobacco use. EXAM: CHEST  2 VIEW COMPARISON:  April 02, 2017 FINDINGS: Lungs are clear. Heart size and pulmonary vascularity are normal. No adenopathy. No pneumothorax. No bone lesions. IMPRESSION: No edema or consolidation.  Electronically Signed   By: Bretta Bang III M.D.   On: 04/28/2017 09:03   Ct Head Wo Contrast Result Date: 04/28/2017 CLINICAL DATA:  Facial and extremity numbness EXAM: CT HEAD WITHOUT CONTRAST TECHNIQUE: Contiguous axial images were obtained from the base of the skull through the vertex without intravenous contrast. COMPARISON:  None. FINDINGS: Brain: The ventricles are normal in size and configuration. There is no intracranial mass, hemorrhage, extra-axial fluid collection, or midline shift. The gray-white compartments are normal. No evident acute infarct. Vascular: No hyperdense vessel.  No vascular calcification evident. Skull: The bony calvarium appears intact. Sinuses/Orbits: Visualized paranasal sinuses are clear. Orbits appear symmetric bilaterally. Other: Mastoid air cells are clear. IMPRESSION: Study within normal limits. Electronically Signed   By: Bretta Bang III M.D.   On: 04/28/2017 09:05    1125:  Workup reassuring. Pt encouraged to stop using drugs, and obtain PMD for good continuity of care and control of his chronic symptoms. Dx and testing d/w pt.  Questions answered.  Verb understanding, agreeable to d/c home with outpt f/u.    Final Clinical Impressions(s) / ED Diagnoses   Final diagnoses:  Paresthesias    ED Discharge Orders    None       Samuel Jester, DO 05/01/17 2050

## 2017-04-28 NOTE — Discharge Instructions (Signed)
Take your usual prescriptions as previously directed.  Call your regular medical doctor today to schedule a follow up appointment within the next week.  Return to the Emergency Department immediately sooner if worsening.  ° °

## 2017-07-03 ENCOUNTER — Encounter (HOSPITAL_COMMUNITY): Payer: Self-pay | Admitting: Emergency Medicine

## 2017-07-03 ENCOUNTER — Other Ambulatory Visit: Payer: Self-pay

## 2017-07-03 ENCOUNTER — Emergency Department (HOSPITAL_COMMUNITY): Payer: No Typology Code available for payment source

## 2017-07-03 ENCOUNTER — Emergency Department (HOSPITAL_COMMUNITY)
Admission: EM | Admit: 2017-07-03 | Discharge: 2017-07-03 | Disposition: A | Discharge status: Home / Self Care | Payer: No Typology Code available for payment source | Attending: Emergency Medicine | Admitting: Emergency Medicine

## 2017-07-03 DIAGNOSIS — Y998 Other external cause status: Secondary | ICD-10-CM | POA: Diagnosis not present

## 2017-07-03 DIAGNOSIS — Y9389 Activity, other specified: Secondary | ICD-10-CM | POA: Diagnosis not present

## 2017-07-03 DIAGNOSIS — S161XXA Strain of muscle, fascia and tendon at neck level, initial encounter: Secondary | ICD-10-CM | POA: Insufficient documentation

## 2017-07-03 DIAGNOSIS — F17210 Nicotine dependence, cigarettes, uncomplicated: Secondary | ICD-10-CM | POA: Insufficient documentation

## 2017-07-03 DIAGNOSIS — Y92410 Unspecified street and highway as the place of occurrence of the external cause: Secondary | ICD-10-CM | POA: Diagnosis not present

## 2017-07-03 DIAGNOSIS — S199XXA Unspecified injury of neck, initial encounter: Secondary | ICD-10-CM | POA: Diagnosis present

## 2017-07-03 MED ORDER — IBUPROFEN 600 MG PO TABS: 600.0000 mg | ORAL_TABLET | Freq: Four times a day (QID) | ORAL | 0 refills | Status: DC | PRN

## 2017-07-03 NOTE — ED Notes (Signed)
c collar placed on pt

## 2017-07-03 NOTE — Discharge Instructions (Signed)
Return if any problems.  Ice to area of pain  °
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
## 2017-07-03 NOTE — ED Notes (Signed)
Pt alert & oriented x4, stable gait. Patient given discharge instructions, paperwork & prescription(s). Patient  instructed to stop at the registration desk to finish any additional paperwork. Patient verbalized understanding. Pt left department w/ no further questions. 
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
## 2017-07-09 ENCOUNTER — Emergency Department (HOSPITAL_COMMUNITY)
Admission: EM | Admit: 2017-07-09 | Discharge: 2017-07-09 | Disposition: A | Discharge status: Home / Self Care | Payer: Self-pay | Attending: Emergency Medicine | Admitting: Emergency Medicine

## 2017-07-09 ENCOUNTER — Encounter (HOSPITAL_COMMUNITY): Payer: Self-pay

## 2017-07-09 ENCOUNTER — Other Ambulatory Visit: Payer: Self-pay

## 2017-07-09 DIAGNOSIS — F17210 Nicotine dependence, cigarettes, uncomplicated: Secondary | ICD-10-CM | POA: Insufficient documentation

## 2017-07-09 DIAGNOSIS — M62830 Muscle spasm of back: Secondary | ICD-10-CM | POA: Insufficient documentation

## 2017-07-09 MED ORDER — PREDNISONE 10 MG PO TABS: 20.0000 mg | ORAL_TABLET | Freq: Two times a day (BID) | ORAL | 0 refills | Status: DC

## 2017-07-09 MED ORDER — CYCLOBENZAPRINE HCL 10 MG PO TABS: 10.0000 mg | ORAL_TABLET | Freq: Two times a day (BID) | ORAL | 0 refills | Status: DC | PRN

## 2017-07-09 NOTE — Discharge Instructions (Signed)
You have muscle spasm of your back. Take the medication as directed. The muscle relaxer can make you sleepy. Do not drive or work while taking the muscle relaxer. Follow up with Dr. Romeo AppleHarrison.
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
## 2017-07-09 NOTE — ED Notes (Signed)
No answer when called from lobby for triage 
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
## 2017-12-06 ENCOUNTER — Encounter (HOSPITAL_COMMUNITY): Payer: Self-pay | Admitting: Emergency Medicine

## 2017-12-06 ENCOUNTER — Emergency Department (HOSPITAL_COMMUNITY)
Admission: EM | Admit: 2017-12-06 | Discharge: 2017-12-06 | Disposition: A | Discharge status: Home / Self Care | Payer: Self-pay | Attending: Emergency Medicine | Admitting: Emergency Medicine

## 2017-12-06 ENCOUNTER — Emergency Department (HOSPITAL_COMMUNITY): Payer: Self-pay

## 2017-12-06 ENCOUNTER — Other Ambulatory Visit: Payer: Self-pay

## 2017-12-06 DIAGNOSIS — F17210 Nicotine dependence, cigarettes, uncomplicated: Secondary | ICD-10-CM | POA: Insufficient documentation

## 2017-12-06 DIAGNOSIS — R091 Pleurisy: Secondary | ICD-10-CM

## 2017-12-06 DIAGNOSIS — Z79899 Other long term (current) drug therapy: Secondary | ICD-10-CM | POA: Insufficient documentation

## 2017-12-06 DIAGNOSIS — M25511 Pain in right shoulder: Secondary | ICD-10-CM | POA: Insufficient documentation

## 2017-12-06 LAB — I-STAT CHEM 8, ED
BUN: 11 mg/dL (ref 6–20)
Calcium, Ion: 1.2 mmol/L (ref 1.15–1.40)
Chloride: 101 mmol/L (ref 98–111)
Creatinine, Ser: 0.8 mg/dL (ref 0.61–1.24)
Glucose, Bld: 104 mg/dL — ABNORMAL HIGH (ref 70–99)
HCT: 46 % (ref 39.0–52.0)
Hemoglobin: 15.6 g/dL (ref 13.0–17.0)
Potassium: 4.2 mmol/L (ref 3.5–5.1)
Sodium: 139 mmol/L (ref 135–145)
TCO2: 24 mmol/L (ref 22–32)

## 2017-12-06 LAB — D-DIMER, QUANTITATIVE (NOT AT ARMC)

## 2017-12-06 MED ORDER — IBUPROFEN 400 MG PO TABS
400.0000 mg | ORAL_TABLET | Freq: Once | ORAL | Status: AC
  Administered 2017-12-06: 400 mg via ORAL
  Filled 2017-12-06: qty 1

## 2017-12-06 NOTE — ED Provider Notes (Signed)
[facility]   CSN: [id number] Arrival date & time: 12/06/17  0405     History   Chief Complaint Chief Complaint  Patient presents with  . Shoulder Pain     Pt seen with NP student, I performed history/physical/documentation  HPI Scott Reilly is a 22 y.o. male.  The history is provided by the patient.  Shoulder Pain   This is a new problem. The current episode started yesterday. The problem occurs constantly. The problem has been gradually worsening. The pain is present in the right shoulder. The pain is severe. Exacerbated by: Deep breathing. He has tried nothing for the symptoms.  Patient reports gradual onset of right shoulder pain around his scapula He denies any trauma.  He reports pain is worse with breathing.  No pain with movement of his upper arm. No anterior chest pain, no hemoptysis.  He reports mild shortness of breath.  No recent travel or surgery.  Past Medical History:  Diagnosis Date  . Alcohol abuse   . Cocaine abuse (HCC)   . Marijuana use   . Tobacco use     There are no active problems to display for this patient.   History reviewed. No pertinent surgical history.      Home Medications    Prior to Admission medications   Medication Sig Start Date End Date Taking? Authorizing Provider  cyclobenzaprine (FLEXERIL) 10 MG tablet Take 1 tablet (10 mg total) by mouth 2 (two) times daily as needed for muscle spasms. 07/09/17   Janne NapoleonNeese, Hope M, NP  ibuprofen (ADVIL,MOTRIN) 600 MG tablet Take 1 tablet (600 mg total) by mouth every 6 (six) hours as needed. 07/03/17   Elson AreasSofia, Leslie K, PA-C  predniSONE (DELTASONE) 10 MG tablet Take 2 tablets (20 mg total) by mouth 2 (two) times daily with a meal. 07/09/17   Janne NapoleonNeese, Hope M, NP    Family History No family history on file.  Social History Social History   Tobacco Use  . Smoking status: Current Every Day Smoker    Packs/day: 1.00    Types: Cigarettes  . Smokeless tobacco:  Never Used  Substance Use Topics  . Alcohol use: Yes    Comment: "on weekends"  . Drug use: No    Types: Cocaine, Marijuana     Allergies   Patient has no known allergies.   Review of Systems Review of Systems  Constitutional: Negative for fever.  Respiratory: Positive for shortness of breath.   Musculoskeletal: Positive for arthralgias.  All other systems reviewed and are negative.    Physical Exam Updated Vital Signs BP (!) 132/92   Pulse 64   Temp 98.3 F (36.8 C) (Oral)   Resp 16   Ht 1.727 m (5\' 8" )   Wt 86.2 kg (190 lb)   SpO2 94%   BMI 28.89 kg/m   Physical Exam CONSTITUTIONAL: Well developed/well nourished, anxious HEAD: Normocephalic/atraumatic EYES: EOMI/PERRL ENMT: Mucous membranes moist NECK: supple no meningeal signs SPINE/BACK:entire spine nontender CV: S1/S2 noted, no murmurs/rubs/gallops noted LUNGS: Lungs are clear to auscultation bilaterally, no apparent distress ABDOMEN: soft, nontender, no rebound or guarding, bowel sounds noted throughout abdomen GU:no cva tenderness NEURO: Pt is awake/alert/appropriate, moves all extremitiesx4.  No facial droop.   EXTREMITIES: pulses normal/equal, full ROM, no tenderness noted to right shoulder or right scapula.  No edema, no erythema no deformities to right shoulder No calf tenderness or edema SKIN: warm, color normal PSYCH: Anxious  ED Treatments / Results  Labs (all labs ordered are listed, but only abnormal results are displayed) Labs Reviewed  I-STAT CHEM 8, ED - Abnormal; Notable for the following components:      Result Value   Glucose, Bld 104 (*)    All other components within normal limits  D-DIMER, QUANTITATIVE (NOT AT [facility])    EKG EKG Interpretation  Date/Time:  Wednesday December 06 2017 05:00:11 EDT Ventricular Rate:  54 PR Interval:    QRS Duration: 82 QT Interval:  402 QTC Calculation: 381 R Axis:   71 Text Interpretation:  Sinus rhythm ST elev, probable normal early repol  pattern No significant change since last tracing Confirmed by Zadie Rhine ([id number]) on 12/06/2017 5:04:53 AM   Radiology Dg Chest 2 View  Result Date: 12/06/2017 CLINICAL DATA:  22 year old male with chest pain. EXAM: CHEST - 2 VIEW COMPARISON:  Chest radiograph dated 04/28/2017 FINDINGS: The heart size and mediastinal contours are within normal limits. Both lungs are clear. The visualized skeletal structures are unremarkable. IMPRESSION: No active cardiopulmonary disease. Electronically Signed   By: Elgie Collard M.D.   On: 12/06/2017 06:01    Procedures Procedures (including critical care time)  Medications Ordered in ED Medications  ibuprofen (ADVIL,MOTRIN) tablet 400 mg (400 mg Oral Given 12/06/17 0457)     Initial Impression / Assessment and Plan / ED Course  I have reviewed the triage vital signs and the nursing notes.  Pertinent labs & imaging results that were available during my care of the patient were reviewed by me and considered in my medical decision making (see chart for details).     5:00 AM Patient reports atraumatic right shoulder pain, only seems to be worse with deep breathing.  It cannot be reproduced on exam.  Will start with chest x-ray and EKG   Patient kept Reporting pleuritic pain, and pulse ox did drop to 93% with moving.  Fortunately d-dimer was negative, patient is overall well-appearing.  Suspicion for PE is low.  Will discharge home  Final Clinical Impressions(s) / ED Diagnoses   Final diagnoses:  Pleurisy  Acute pain of right shoulder    ED Discharge Orders    None       Zadie Rhine, MD 12/06/17 [phone]

## 2017-12-06 NOTE — ED Notes (Signed)
Ambulated patient in hallway, HR maintained b/w 66-79bpm and saturations b/w 93-97% on room air. Pt says his pain is somewhat better, denies worsening pain or shortness of breath.

## 2017-12-06 NOTE — ED Triage Notes (Signed)
Pt c/o right shoulder pain after taking a nap yesterday, he denies any injury.

## 2017-12-14 IMAGING — CT CT HEAD W/O CM
3 series · 16 of 47 positions shown, 19 images · non-contrast
Comparison: None.

CLINICAL DATA: Facial and extremity numbness

EXAM:
CT HEAD WITHOUT CONTRAST
TECHNIQUE: Contiguous axial images were obtained from the base of the skull
through the vertex without intravenous contrast.

[Series 2: head wo · axial · 0.46mm/px · z∈[+26,+151]mm · 10 of 31 slices shown, 13 images]
[im 3/31  brain]
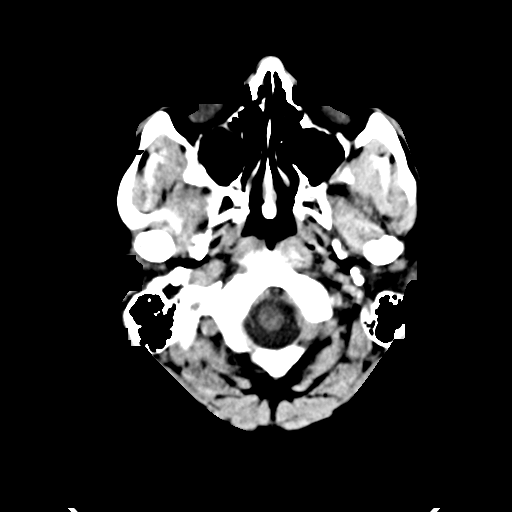
[im 3/31  bone]
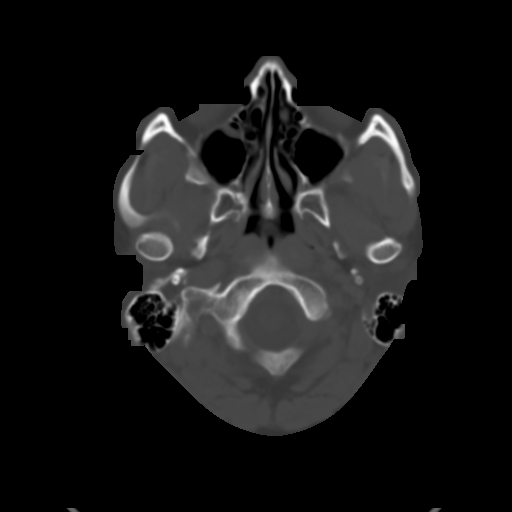
[im 6/31  brain]
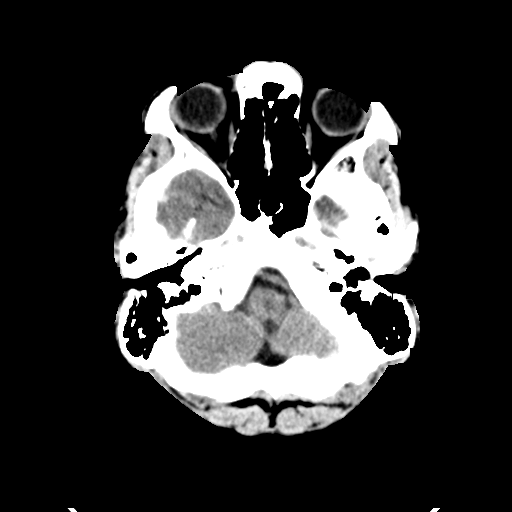
[im 9/31  brain]
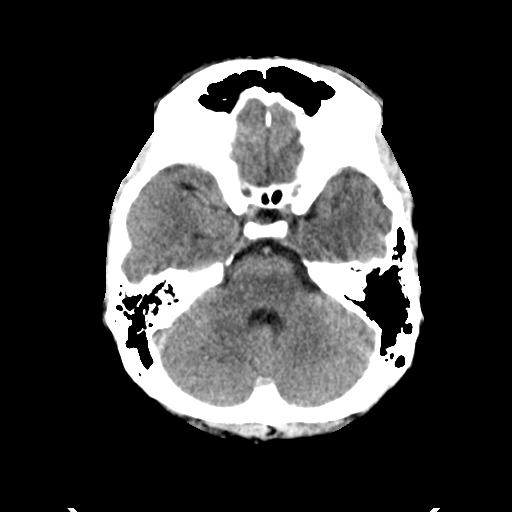
[im 11/31  brain]
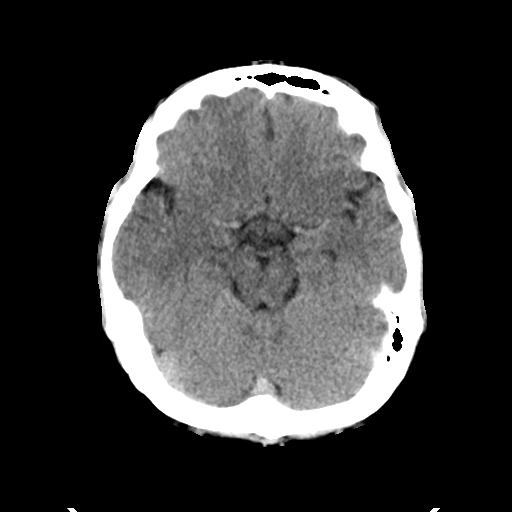
[im 14/31  brain]
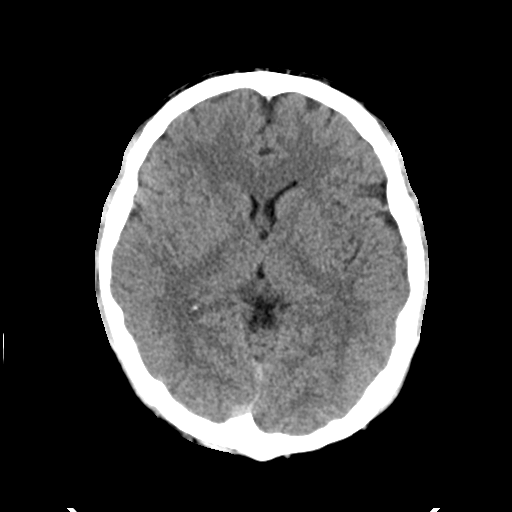
[im 14/31  bone]
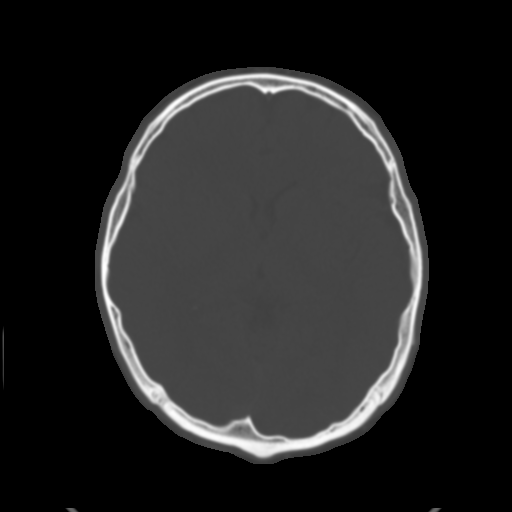
[im 17/31  brain]
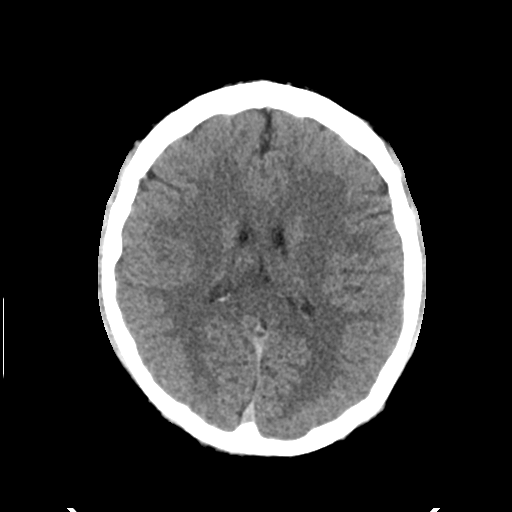
[im 20/31  brain]
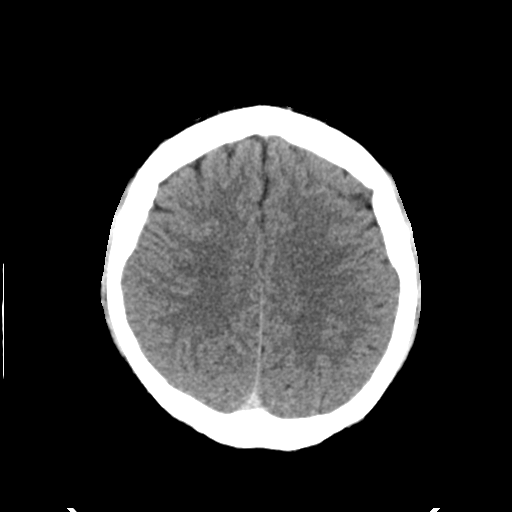
[im 23/31  brain]
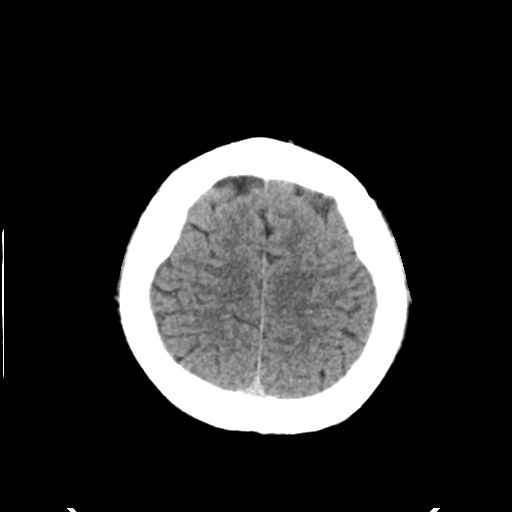
[im 25/31  brain]
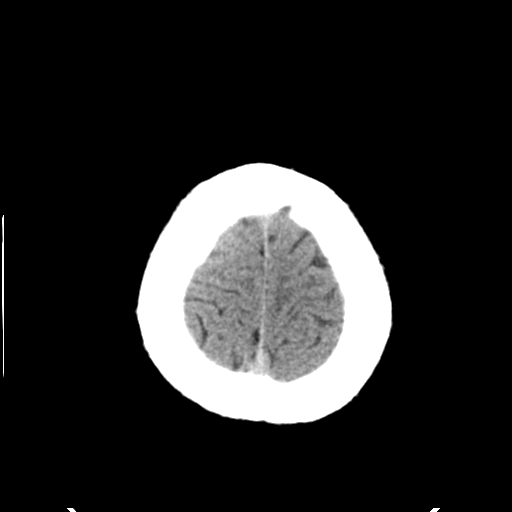
[im 25/31  bone]
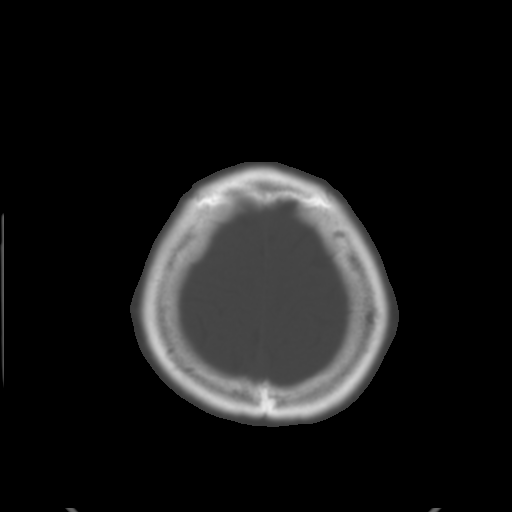
[im 28/31  brain]
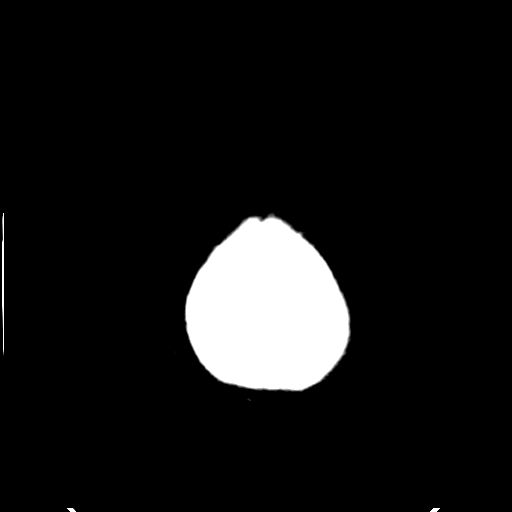

[Series 4: coronal soft tissue · coronal · 0.33mm/px · 3 of 66 slices shown]
[im 22/66  brain]
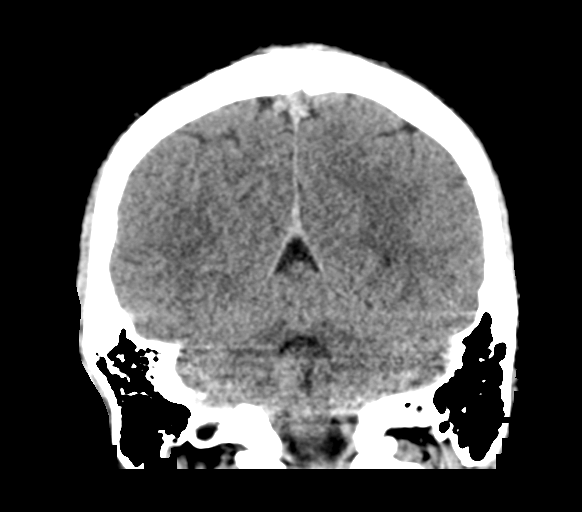
[im 29/66  brain]
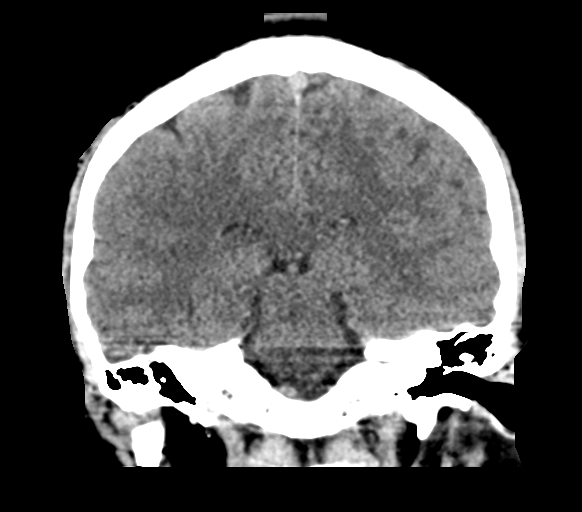
[im 37/66  brain]
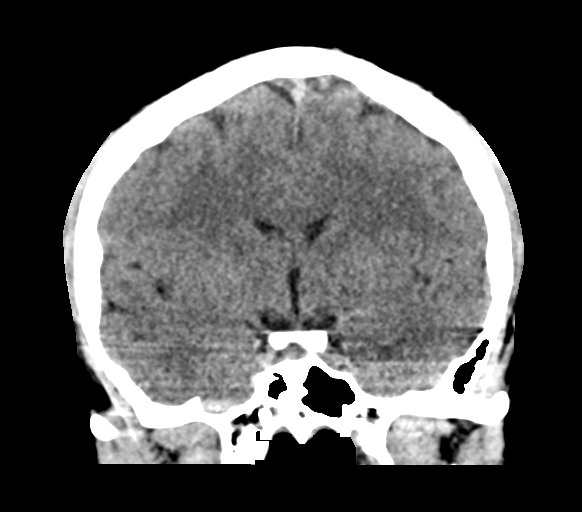

[Series 5: sagittal soft tissue · sagittal · 0.35mm/px · 3 of 60 slices shown]
[im 20/60  brain]
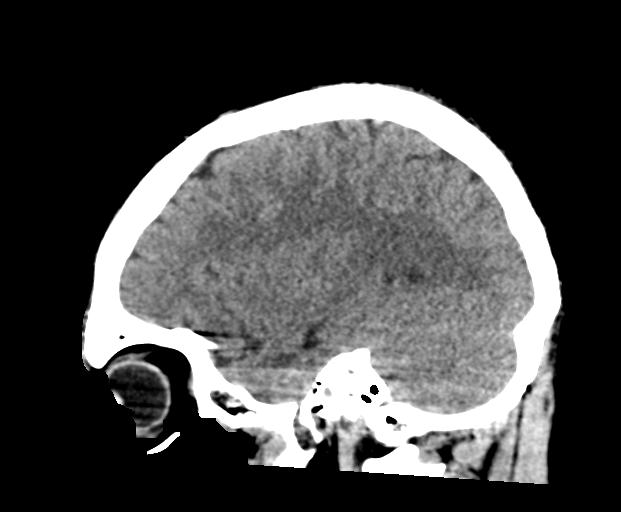
[im 30/60  brain]
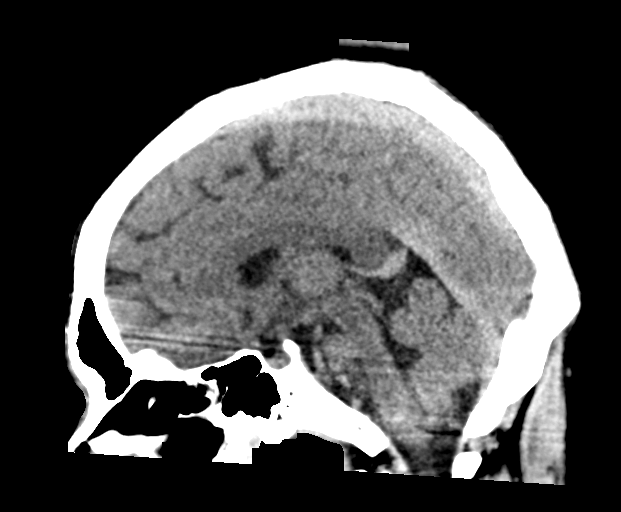
[im 40/60  brain]
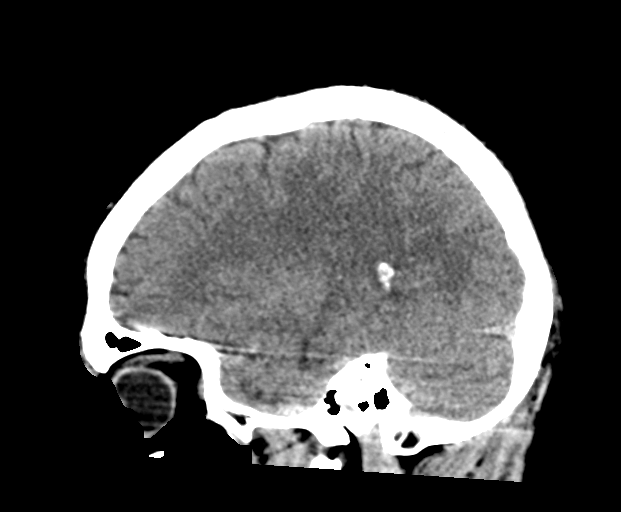

[16 of 47 positions shown; findings below may reference images not displayed]

FINDINGS: Brain: The ventricles are normal in size and configuration. There is
no intracranial mass, hemorrhage, extra-axial fluid collection, or
midline shift. The gray-white compartments are normal. No evident
acute infarct.

Vascular: No hyperdense vessel.  No vascular calcification evident.

Skull: The bony calvarium appears intact.

Sinuses/Orbits: Visualized paranasal sinuses are clear. Orbits
appear symmetric bilaterally.

Other: Mastoid air cells are clear.
IMPRESSION: Study within normal limits.

## 2018-07-24 IMAGING — DX DG CHEST 2V
2 series · 2 of 2 positions shown · non-contrast
Comparison: Chest radiograph dated 04/28/2017

CLINICAL DATA: 22-year-old male with chest pain.

EXAM:
CHEST - 2 VIEW

[chest pa]
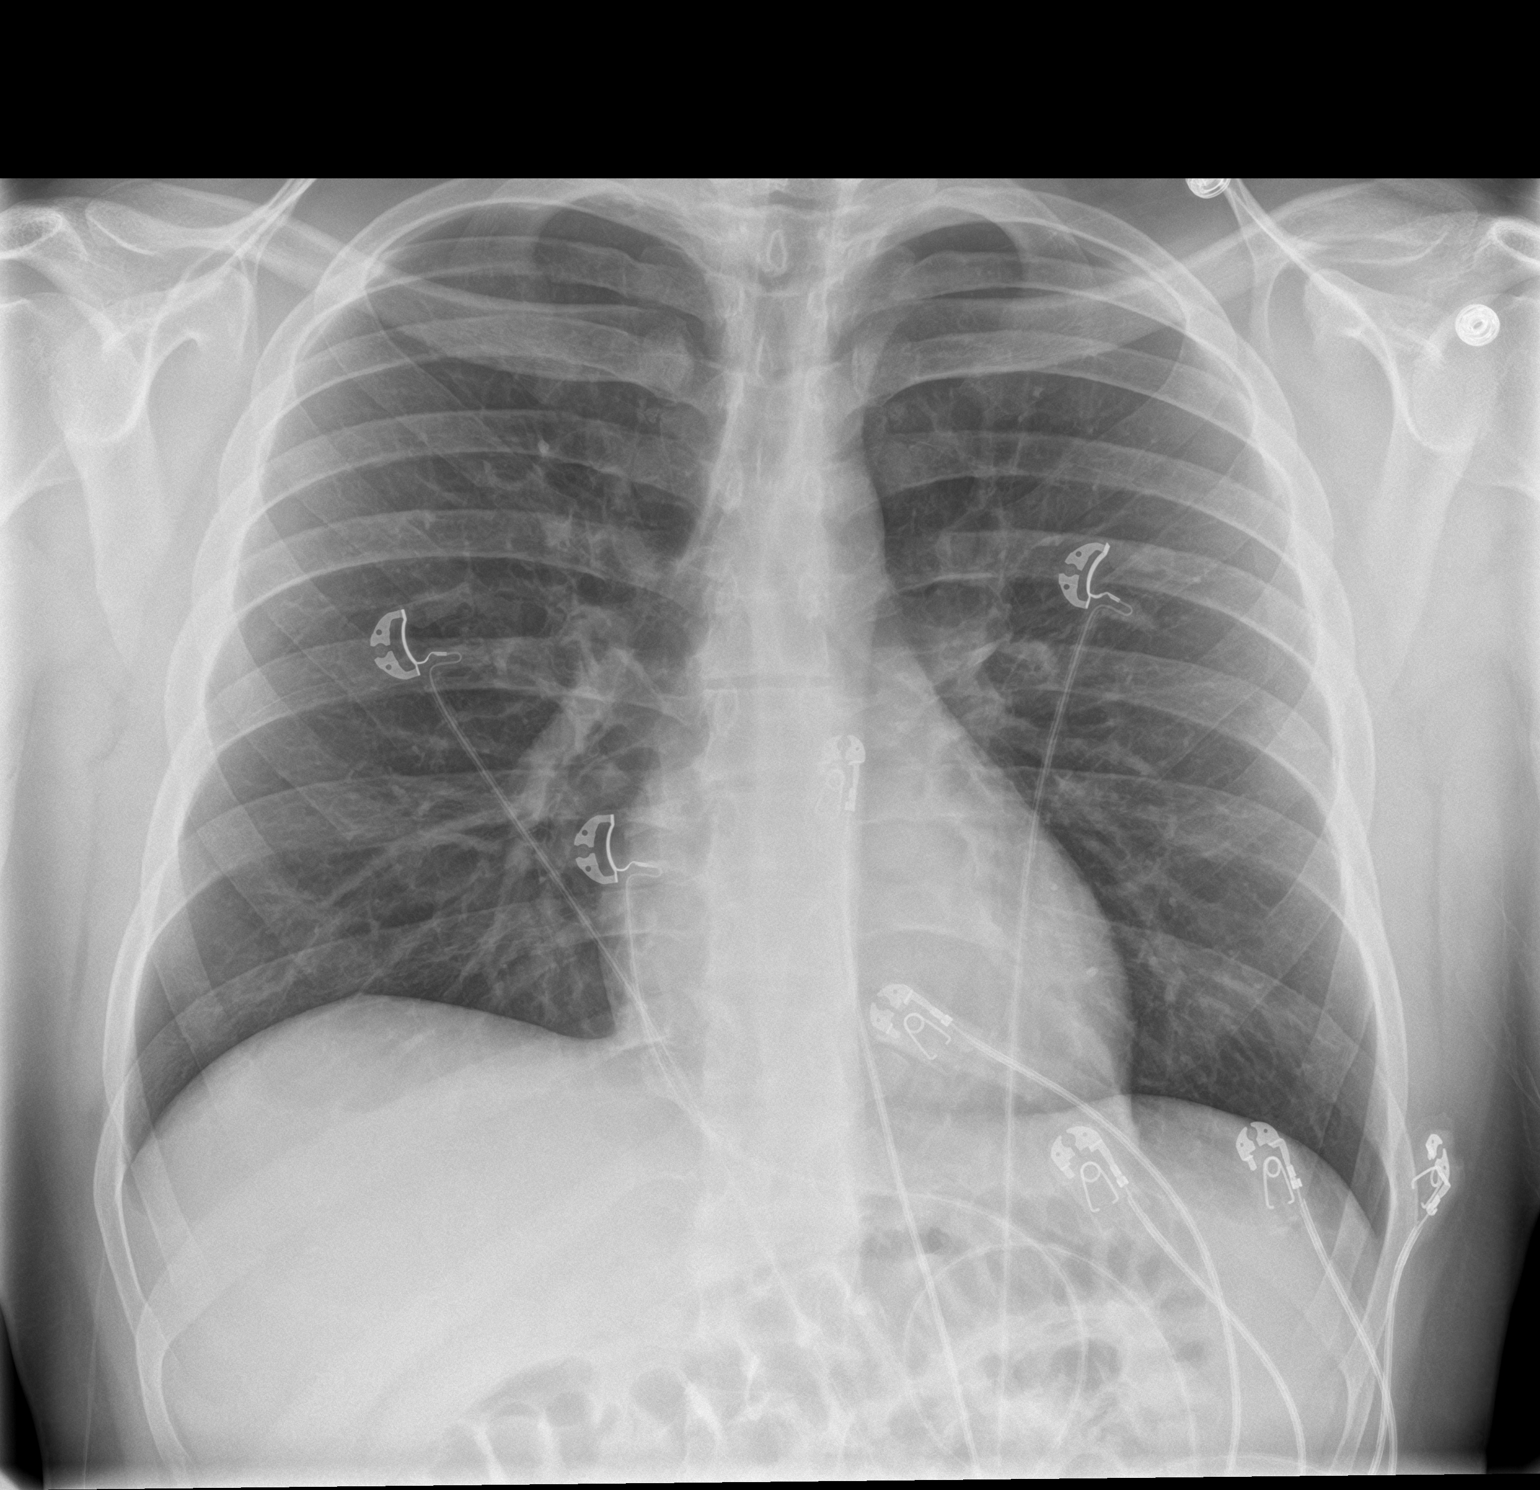

[chest lat]
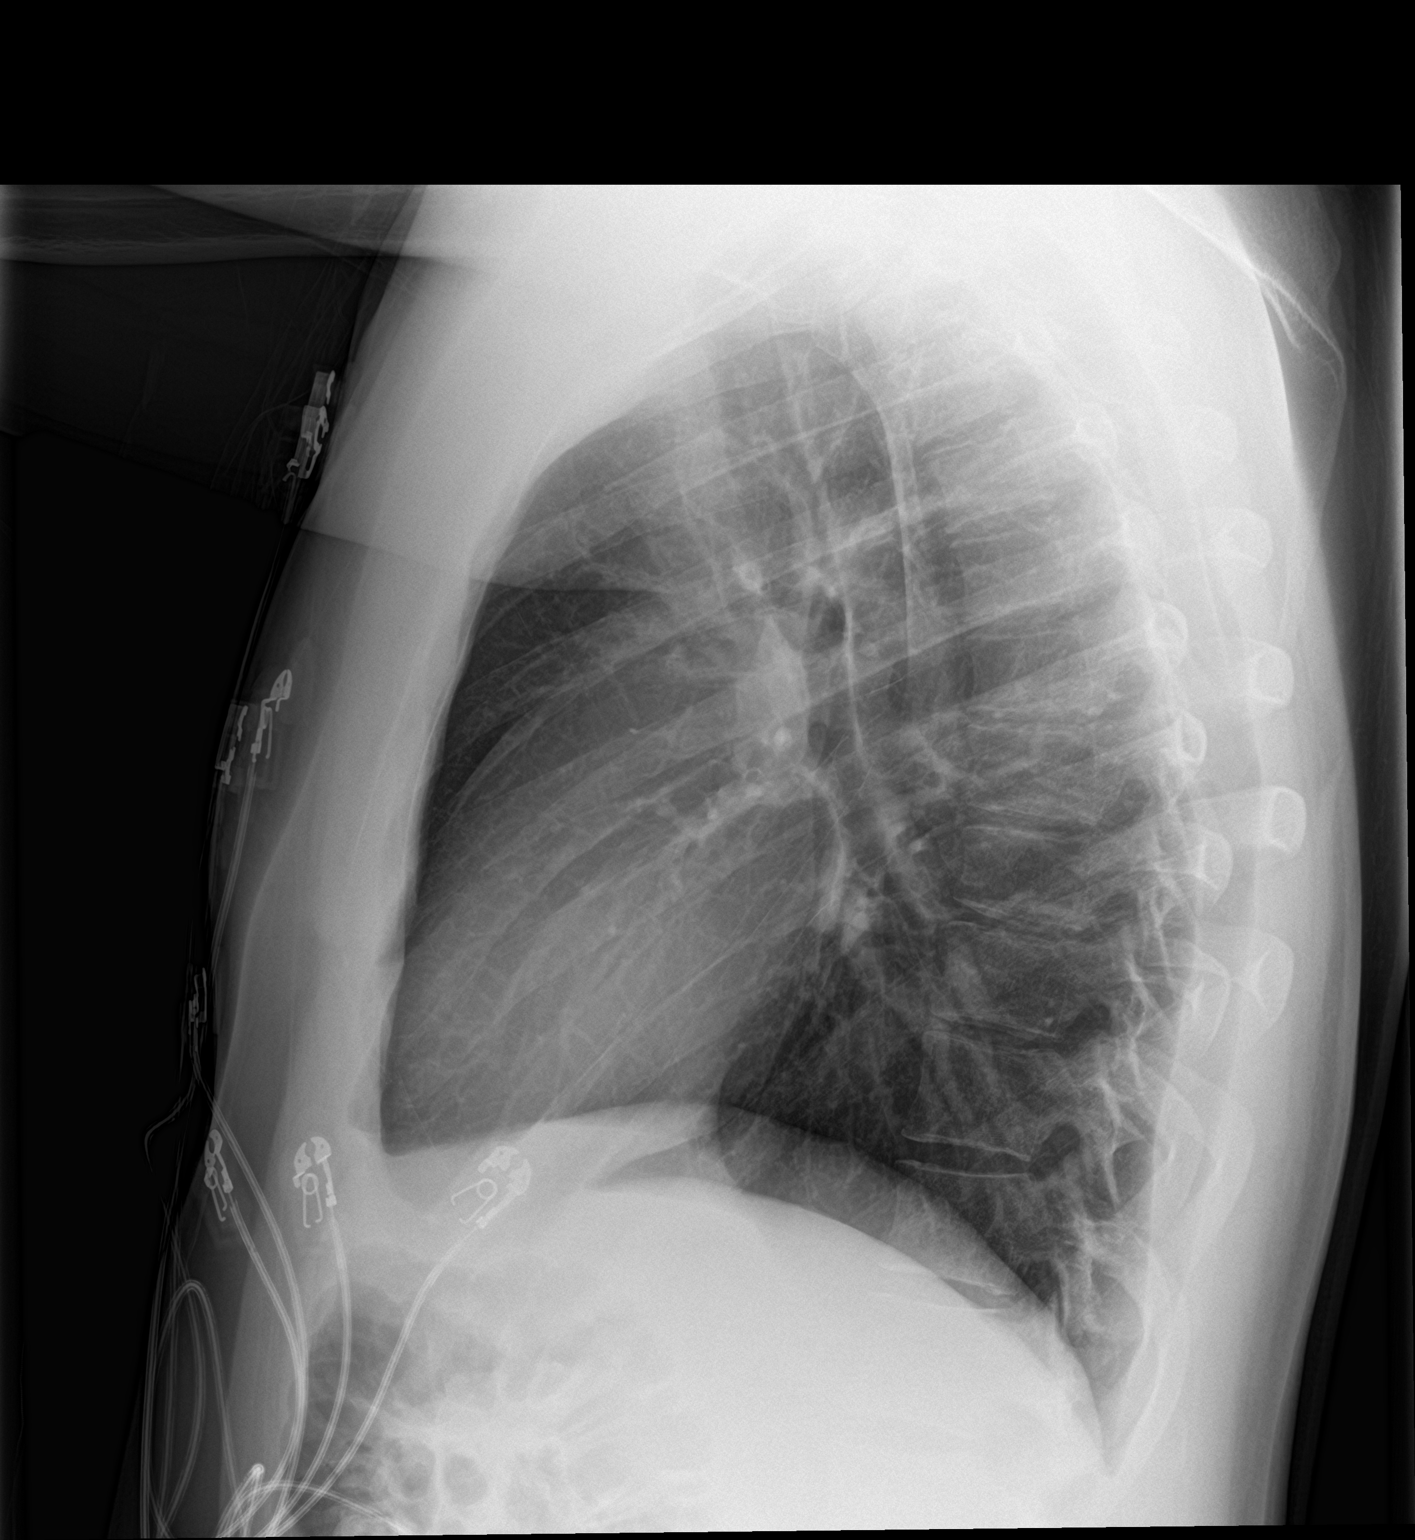

[2 of 2 positions shown; findings below may reference images not displayed]

FINDINGS: The heart size and mediastinal contours are within normal limits.
Both lungs are clear. The visualized skeletal structures are
unremarkable.
IMPRESSION: No active cardiopulmonary disease.

## 2019-05-14 ENCOUNTER — Other Ambulatory Visit: Payer: Self-pay | Admitting: *Deleted

## 2019-05-14 DIAGNOSIS — Z20822 Contact with and (suspected) exposure to covid-19: Secondary | ICD-10-CM

## 2019-05-16 LAB — NOVEL CORONAVIRUS, NAA: SARS-CoV-2, NAA: NOT DETECTED

## 2022-08-18 ENCOUNTER — Emergency Department (HOSPITAL_COMMUNITY)
Admission: EM | Admit: 2022-08-18 | Discharge: 2022-08-18 | Discharge status: Against Medical Advice | Payer: Self-pay | Attending: Emergency Medicine | Admitting: Emergency Medicine

## 2022-08-18 ENCOUNTER — Other Ambulatory Visit: Payer: Self-pay

## 2022-08-18 ENCOUNTER — Encounter (HOSPITAL_COMMUNITY): Payer: Self-pay

## 2022-08-18 DIAGNOSIS — Z5329 Procedure and treatment not carried out because of patient's decision for other reasons: Secondary | ICD-10-CM | POA: Insufficient documentation

## 2022-08-18 DIAGNOSIS — Y92410 Unspecified street and highway as the place of occurrence of the external cause: Secondary | ICD-10-CM | POA: Diagnosis not present

## 2022-08-18 DIAGNOSIS — R000 Tachycardia, unspecified: Secondary | ICD-10-CM | POA: Insufficient documentation

## 2022-08-18 DIAGNOSIS — S0990XA Unspecified injury of head, initial encounter: Secondary | ICD-10-CM | POA: Diagnosis present

## 2022-08-18 DIAGNOSIS — S01311A Laceration without foreign body of right ear, initial encounter: Secondary | ICD-10-CM | POA: Diagnosis not present
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
## 2022-08-18 NOTE — ED Notes (Signed)
Pt refused all treatment, Dr. Elpidio Anis notified.

## 2022-08-18 NOTE — ED Notes (Signed)
Pt left AMA refuse to sign AMA documentation.
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
## 2022-08-19 ENCOUNTER — Emergency Department (HOSPITAL_COMMUNITY)
Admission: EM | Admit: 2022-08-19 | Discharge: 2022-08-19 | Discharge status: Against Medical Advice | Payer: No Typology Code available for payment source

## 2022-08-19 NOTE — ED Notes (Signed)
Called for pt, no answer

## 2023-10-05 ENCOUNTER — Ambulatory Visit
Admission: EM | Admit: 2023-10-05 | Discharge: 2023-10-05 | Disposition: A | Discharge status: Home / Self Care | Payer: Self-pay | Attending: Nurse Practitioner | Admitting: Nurse Practitioner

## 2023-10-05 DIAGNOSIS — Z202 Contact with and (suspected) exposure to infections with a predominantly sexual mode of transmission: Secondary | ICD-10-CM | POA: Insufficient documentation

## 2023-10-05 DIAGNOSIS — Z113 Encounter for screening for infections with a predominantly sexual mode of transmission: Secondary | ICD-10-CM | POA: Insufficient documentation

## 2023-10-05 MED ORDER — DOXYCYCLINE HYCLATE 100 MG PO TABS: 100.0000 mg | ORAL_TABLET | Freq: Two times a day (BID) | ORAL | 0 refills | Status: AC

## 2023-10-05 MED ORDER — CEFTRIAXONE SODIUM 500 MG IJ SOLR
500.0000 mg | INTRAMUSCULAR | Status: DC
  Administered 2023-10-05: 500 mg via INTRAMUSCULAR

## 2023-10-05 NOTE — ED Triage Notes (Signed)
 Hx of chlamydia. Pt reports to UC to be tested for STDs as his partner has tested positive for Gonorrhea and Chlamydia. Pt reports this is is only partner.   Denies sxs.

## 2023-10-05 NOTE — Discharge Instructions (Addendum)
 You were administered ceftriaxone  500 mg today. Take medication as prescribed. Make sure you take the medication in its entirety.  You should refrain from sexual intercourse while you are taking the medication and for an additional 7 days after completing the medication. Increase condom use with each sexual encounter. Follow-up as needed.

## 2023-10-05 NOTE — ED Provider Notes (Signed)
 [facility]    CSN: [id number] Arrival date & time: 10/05/23  1836      History   Chief Complaint No chief complaint on file.   HPI Scott Reilly is a 28 y.o. male.   The history is provided by the patient.   Patient presents for STI testing.  Patient reports partner told him that she tested positive for gonorrhea and chlamydia.  Patient denies symptoms to include penile discharge, dysuria, abdominal pain, scrotal/testicular pain or swelling, or urinary symptoms.  Patient reports prior history of chlamydia.  Patient reports that he has 1 male partner in the past 90 days.  Past Medical History:  Diagnosis Date   Alcohol abuse    Cocaine abuse (HCC)    Marijuana use    Tobacco use     There are no active problems to display for this patient.   History reviewed. No pertinent surgical history.     Home Medications    Prior to Admission medications   Not on File    Family History History reviewed. No pertinent family history.  Social History Social History   Tobacco Use   Smoking status: Every Day    Current packs/day: 1.00    Types: Cigarettes   Smokeless tobacco: Never  Vaping Use   Vaping status: Never Used  Substance Use Topics   Alcohol use: Yes    Comment: "on weekends"   Drug use: No    Types: Cocaine, Marijuana     Allergies   Patient has no known allergies.   Review of Systems Review of Systems Per HPI  Physical Exam Triage Vital Signs ED Triage Vitals  Encounter Vitals Group     BP 10/05/23 1852 114/70     Systolic BP Percentile --      Diastolic BP Percentile --      Pulse Rate 10/05/23 1852 (!) 102     Resp 10/05/23 1852 18     Temp 10/05/23 1852 99 F (37.2 C)     Temp Source 10/05/23 1852 Oral     SpO2 10/05/23 1852 94 %     Weight --      Height --      Head Circumference --      Peak Flow --      Pain Score 10/05/23 1856 0     Pain Loc --      Pain Education --      Exclude from Growth Chart --     No data found.  Updated Vital Signs BP 114/70 (BP Location: Right Arm)   Pulse (!) 102   Temp 99 F (37.2 C) (Oral)   Resp 18   SpO2 94%   Visual Acuity Right Eye Distance:   Left Eye Distance:   Bilateral Distance:    Right Eye Near:   Left Eye Near:    Bilateral Near:     Physical Exam Vitals and nursing note reviewed.  Constitutional:      General: He is not in acute distress.    Appearance: Normal appearance.  Eyes:     Extraocular Movements: Extraocular movements intact.     Pupils: Pupils are equal, round, and reactive to light.  Cardiovascular:     Rate and Rhythm: Tachycardia present.  Pulmonary:     Effort: Pulmonary effort is normal.  Musculoskeletal:     Cervical back: Normal range of motion.  Skin:    General: Skin is warm and dry.  Neurological:  General: No focal deficit present.     Mental Status: He is alert and oriented to person, place, and time.  Psychiatric:        Mood and Affect: Mood normal.        Behavior: Behavior normal.      UC Treatments / Results  Labs (all labs ordered are listed, but only abnormal results are displayed) Labs Reviewed - No data to display  EKG   Radiology No results found.  Procedures Procedures (including critical care time)  Medications Ordered in UC Medications - No data to display  Initial Impression / Assessment and Plan / UC Course  I have reviewed the triage vital signs and the nursing notes.  Pertinent labs & imaging results that were available during my care of the patient were reviewed by me and considered in my medical decision making (see chart for details).  Cytology swab is pending.  Patient with recent exposure to chlamydia and gonorrhea.  Ceftriaxone  500 mg IM administered along with prescription for doxycycline  100 mg provided.  Patient advised to increase condom use with each sexual encounter.  Patient was also advised to refrain from sexual intercourse while taking the  medication, and for an additional 7 days after completing the medication.  Patient verbalized understanding.  All questions were answered.  Patient stable for discharge.  Final Clinical Impressions(s) / UC Diagnoses   Final diagnoses:  None   Discharge Instructions   None    ED Prescriptions   None    PDMP not reviewed this encounter.   Hardy Lia, NP 10/05/23 1926

## 2023-10-06 LAB — CYTOLOGY, (ORAL, ANAL, URETHRAL) ANCILLARY ONLY
Chlamydia: POSITIVE — AB
Comment: NEGATIVE
Comment: NEGATIVE
Comment: NORMAL
Neisseria Gonorrhea: POSITIVE — AB
Trichomonas: NEGATIVE

## 2023-10-12 ENCOUNTER — Telehealth: Payer: Self-pay

## 2023-10-12 NOTE — Telephone Encounter (Signed)
 Pt called inquiring about getting an additional pill of doxy prescribed as he vomited up 1 of the pills for treatment for Chlamydia. Informed pt that 1 pill will not mess his tx plan up. Pt reminded to consume a meal with the doxy as it will make him vomit/sick. Pt verbalized understanding.
